# Patient Record
Sex: Female | Born: 1950 | Race: White | Hispanic: No | Smoking: Never smoker
Health system: Southern US, Community
[De-identification: ages and names within clinical notes are randomized; demographics above are authoritative.]

## PROBLEM LIST (undated history)

## (undated) DIAGNOSIS — K219 Gastro-esophageal reflux disease without esophagitis: Secondary | ICD-10-CM

## (undated) DIAGNOSIS — I10 Essential (primary) hypertension: Secondary | ICD-10-CM

## (undated) DIAGNOSIS — G809 Cerebral palsy, unspecified: Secondary | ICD-10-CM

## (undated) DIAGNOSIS — IMO0002 Reserved for concepts with insufficient information to code with codable children: Principal | ICD-10-CM

## (undated) DIAGNOSIS — S93439A Sprain of tibiofibular ligament of unspecified ankle, initial encounter: Secondary | ICD-10-CM

## (undated) DIAGNOSIS — S9304XA Dislocation of right ankle joint, initial encounter: Secondary | ICD-10-CM

## (undated) DIAGNOSIS — I872 Venous insufficiency (chronic) (peripheral): Secondary | ICD-10-CM

## (undated) DIAGNOSIS — I639 Cerebral infarction, unspecified: Secondary | ICD-10-CM

## (undated) DIAGNOSIS — R269 Unspecified abnormalities of gait and mobility: Secondary | ICD-10-CM

## (undated) DIAGNOSIS — E559 Vitamin D deficiency, unspecified: Secondary | ICD-10-CM

## (undated) DIAGNOSIS — M810 Age-related osteoporosis without current pathological fracture: Secondary | ICD-10-CM

## (undated) HISTORY — PX: ACHILLES TENDON SURGERY: SHX542

## (undated) HISTORY — PX: WRIST SURGERY: SHX841

## (undated) HISTORY — PX: CHOLECYSTECTOMY: SHX55

## (undated) HISTORY — PX: FRACTURE SURGERY: SHX138

## (undated) HISTORY — PX: KNEE SURGERY: SHX244

---

## 2013-10-17 ENCOUNTER — Encounter (HOSPITAL_COMMUNITY): Payer: Self-pay | Admitting: Pharmacy Technician

## 2013-10-20 ENCOUNTER — Encounter (HOSPITAL_COMMUNITY): Payer: Self-pay | Admitting: *Deleted

## 2013-10-20 MED ORDER — CEFAZOLIN SODIUM-DEXTROSE 2-3 GM-% IV SOLR
2.0000 g | INTRAVENOUS | Status: AC
  Administered 2013-10-21: 2 g via INTRAVENOUS
  Filled 2013-10-20: qty 50

## 2013-10-20 NOTE — H&P (Signed)
[facility] H&P  Chief Complaint: subacute/chronic R ankle dislocation HPI:   63 y/o white femail with hx of cerebral palsy, sustained a ground level fall in October of 2014.  Pt Had inability to bear weight on R leg. Was [facility] for [facility] where she was found to have a R ankle fracture with subluxation of talus.   She was reduced and splinted in the ED. She was discharged to SNF and had follow up with [facility].  She had serial cast changes as well and was treated without surgery.  On the most recent visit to [facility] it was noted that her reduction was not maintained. She was referred to OTS for definitive management.  She was seen and evaluated in our office her cast was removed, which had been on approximately 6 weeks, after additional xrays were obtained.  Her skin was checked as well.  Pt reports that she was ambulatory with a walker prior to this incident.  After much discussion of treatment options pt was agreeable to undergo tibiotalocalcaneal fusion with IMN due to prolonged subluxation of her talus and injury to her syndesmosis.   Past Medical History  Diagnosis Date  . Hypertension   . CVA (cerebral infarction)     Hx: of  . GERD (gastroesophageal reflux disease)   . Gait abnormality     Hx: of  . Cerebral palsy   . Osteoporosis     Past Surgical History  Procedure Laterality Date  . Fracture surgery      Hx: of right foot  . Knee surgery Right   . Wrist surgery Right   . Achilles tendon surgery Right   . Cholecystectomy      History reviewed. No pertinent family history. Social History:  has an unknown smoking status. She has never used smokeless tobacco. She reports that she does not drink alcohol or use illicit drugs.  Allergies:  Allergies  Allergen Reactions  . Bacitracin Other (See Comments)    Per MAR  . Bee Venom Other (See Comments)    Per MAR  . Erythromycin Base Other (See Comments)    Per MAR  .  Neomycin Other (See Comments)    Per MAR  . Polymyxin B Other (See Comments)    Per MAR    Medications Prior to Admission  Medication Sig Dispense Refill  . aspirin EC 81 MG tablet Take 81 mg by mouth daily.      . Calcium Carbonate-Vitamin D (CALCIUM 600+D HIGH POTENCY) 600-400 MG-UNIT per tablet Take 2 tablets by mouth daily.      Marland Kitchen. HYDROcodone-acetaminophen (NORCO) 10-325 MG per tablet Take 1 tablet by mouth 2 (two) times daily. Scheduled:  9am, 7pm  (Not to exceed 4 gms of acetaminophen in 24 hours)      . HYDROcodone-acetaminophen (NORCO/VICODIN) 5-325 MG per tablet Take 1 tablet by mouth every 4 (four) hours as needed for moderate pain. Not to exceed 4 gms of acetaminophen in 24 hours      . loratadine (CLARITIN) 10 MG tablet Take 10 mg by mouth daily.      Marland Kitchen. losartan-hydrochlorothiazide (HYZAAR) 100-12.5 MG per tablet Take 1 tablet by mouth daily.      Marland Kitchen. lovastatin (MEVACOR) 40 MG tablet Take 40 mg by mouth at bedtime.      . meloxicam (MOBIC) 7.5 MG tablet Take 7.5 mg by mouth daily.      . Omega-3 Fatty Acids (FISH OIL) 500 MG CAPS Take  500 mg by mouth daily.      Marland Kitchen omeprazole (PRILOSEC) 20 MG capsule Take 20 mg by mouth daily.      . polyethylene glycol (MIRALAX / GLYCOLAX) packet Take 17 g by mouth daily as needed (constipation).      Marland Kitchen azithromycin (ZITHROMAX) 500 MG tablet Take 500 mg by mouth daily. 3 day course started 10/09/13      . cyclobenzaprine (FLEXERIL) 10 MG tablet Take 10 mg by mouth 3 (three) times daily as needed for muscle spasms.      Marland Kitchen EPINEPHrine (EPIPEN) 0.3 mg/0.3 mL SOAJ injection Inject 0.3 mg into the muscle as needed (allergic reaction).          Review of Systems  Constitutional: Negative for fever and chills.  Respiratory: Negative for shortness of breath.   Cardiovascular: Negative for chest pain and palpitations.  Gastrointestinal: Negative for nausea and vomiting.  Genitourinary: Negative for dysuria.  Musculoskeletal:       No specific R  ankle pain  Contractures to R  Hand, wrist Valgus deformity  R knee   Neurological: Negative for headaches.    Height 5\' 6"  (1.676 m), weight 0 kg (0 lb). Physical Exam  Constitutional: She is oriented to person, place, and time.  Pleasant 63 y/o female in WC, NAD  HENT:  Head: Normocephalic and atraumatic.  Cardiovascular: Normal rate and regular rhythm.   Respiratory: Effort normal and breath sounds normal. She has no wheezes.  GI: Soft. Bowel sounds are normal. She exhibits no distension. There is no tenderness.  Musculoskeletal:  Right lower extremity notable for significant valgus to R knee. Skin intact, no evidence of pressures sores or ulcerations. Distal motor and sensory functions grossly intact Swelling controlled + DP pulse noted   Neurological: She is alert and oriented to person, place, and time.  Skin: Skin is warm.     Assessment/Plan  63 y/o female s/p fall with R ankle fx/dislocation with persistent lateral dislocation/subluxation R talus and syndesmotic disruption   OR for hindfoot fusion nail NWB x 8 weeks after surgery 2-3 day hospital stay Mobility will be limited by CP Lovenox post op x 3 weeks  Mearl Latin, PA-C [facility] [phone] (P) 10/21/2013, 7:04 AM

## 2013-10-20 NOTE — Progress Notes (Signed)
Pt SDW Call was completed by pt nurse Joetta MannersJodie Robinson of [facility]. Pt had CXR and EKG in October 2014; results requested via fax [facility] with [facility].

## 2013-10-21 ENCOUNTER — Inpatient Hospital Stay (HOSPITAL_COMMUNITY): Payer: Medicare Other

## 2013-10-21 ENCOUNTER — Encounter (HOSPITAL_COMMUNITY)
Admission: RE | Disposition: A | Discharge status: Skilled Nursing Facility | Payer: Self-pay | Source: Ambulatory Visit | Attending: Orthopedic Surgery

## 2013-10-21 ENCOUNTER — Encounter (HOSPITAL_COMMUNITY): Payer: Self-pay | Admitting: *Deleted

## 2013-10-21 ENCOUNTER — Inpatient Hospital Stay (HOSPITAL_COMMUNITY)
Admission: RE | Admit: 2013-10-21 | Discharge: 2013-10-24 | DRG: 493 | Disposition: A | Discharge status: Skilled Nursing Facility | Payer: Medicare Other | Source: Ambulatory Visit | Attending: Orthopedic Surgery | Admitting: Orthopedic Surgery

## 2013-10-21 ENCOUNTER — Inpatient Hospital Stay (HOSPITAL_COMMUNITY): Payer: Medicare Other | Admitting: Anesthesiology

## 2013-10-21 ENCOUNTER — Encounter (HOSPITAL_COMMUNITY): Payer: Medicare Other | Admitting: Anesthesiology

## 2013-10-21 DIAGNOSIS — I872 Venous insufficiency (chronic) (peripheral): Secondary | ICD-10-CM | POA: Diagnosis present

## 2013-10-21 DIAGNOSIS — Z23 Encounter for immunization: Secondary | ICD-10-CM

## 2013-10-21 DIAGNOSIS — E785 Hyperlipidemia, unspecified: Secondary | ICD-10-CM | POA: Diagnosis present

## 2013-10-21 DIAGNOSIS — S8290XS Unspecified fracture of unspecified lower leg, sequela: Secondary | ICD-10-CM

## 2013-10-21 DIAGNOSIS — M24473 Recurrent dislocation, unspecified ankle: Secondary | ICD-10-CM | POA: Diagnosis present

## 2013-10-21 DIAGNOSIS — G809 Cerebral palsy, unspecified: Secondary | ICD-10-CM | POA: Diagnosis present

## 2013-10-21 DIAGNOSIS — Z888 Allergy status to other drugs, medicaments and biological substances status: Secondary | ICD-10-CM

## 2013-10-21 DIAGNOSIS — M24476 Recurrent dislocation, unspecified foot: Secondary | ICD-10-CM

## 2013-10-21 DIAGNOSIS — IMO0002 Reserved for concepts with insufficient information to code with codable children: Principal | ICD-10-CM | POA: Diagnosis present

## 2013-10-21 DIAGNOSIS — Z8673 Personal history of transient ischemic attack (TIA), and cerebral infarction without residual deficits: Secondary | ICD-10-CM

## 2013-10-21 DIAGNOSIS — R259 Unspecified abnormal involuntary movements: Secondary | ICD-10-CM | POA: Diagnosis present

## 2013-10-21 DIAGNOSIS — Z7982 Long term (current) use of aspirin: Secondary | ICD-10-CM

## 2013-10-21 DIAGNOSIS — X088XXS Exposure to other specified smoke, fire and flames, sequela: Secondary | ICD-10-CM

## 2013-10-21 DIAGNOSIS — K219 Gastro-esophageal reflux disease without esophagitis: Secondary | ICD-10-CM | POA: Diagnosis present

## 2013-10-21 DIAGNOSIS — I10 Essential (primary) hypertension: Secondary | ICD-10-CM | POA: Diagnosis present

## 2013-10-21 DIAGNOSIS — D62 Acute posthemorrhagic anemia: Secondary | ICD-10-CM | POA: Diagnosis not present

## 2013-10-21 DIAGNOSIS — Z881 Allergy status to other antibiotic agents status: Secondary | ICD-10-CM

## 2013-10-21 DIAGNOSIS — Z79899 Other long term (current) drug therapy: Secondary | ICD-10-CM

## 2013-10-21 DIAGNOSIS — M810 Age-related osteoporosis without current pathological fracture: Secondary | ICD-10-CM | POA: Diagnosis present

## 2013-10-21 DIAGNOSIS — S93439A Sprain of tibiofibular ligament of unspecified ankle, initial encounter: Secondary | ICD-10-CM | POA: Diagnosis present

## 2013-10-21 DIAGNOSIS — Z91038 Other insect allergy status: Secondary | ICD-10-CM

## 2013-10-21 DIAGNOSIS — E559 Vitamin D deficiency, unspecified: Secondary | ICD-10-CM | POA: Diagnosis present

## 2013-10-21 DIAGNOSIS — S9304XA Dislocation of right ankle joint, initial encounter: Secondary | ICD-10-CM | POA: Diagnosis present

## 2013-10-21 HISTORY — DX: Vitamin D deficiency, unspecified: E55.9

## 2013-10-21 HISTORY — DX: Cerebral infarction, unspecified: I63.9

## 2013-10-21 HISTORY — DX: Cerebral palsy, unspecified: G80.9

## 2013-10-21 HISTORY — DX: Sprain of tibiofibular ligament of unspecified ankle, initial encounter: S93.439A

## 2013-10-21 HISTORY — DX: Venous insufficiency (chronic) (peripheral): I87.2

## 2013-10-21 HISTORY — DX: Gastro-esophageal reflux disease without esophagitis: K21.9

## 2013-10-21 HISTORY — DX: Reserved for concepts with insufficient information to code with codable children: IMO0002

## 2013-10-21 HISTORY — DX: Dislocation of right ankle joint, initial encounter: S93.04XA

## 2013-10-21 HISTORY — DX: Essential (primary) hypertension: I10

## 2013-10-21 HISTORY — DX: Age-related osteoporosis without current pathological fracture: M81.0

## 2013-10-21 HISTORY — DX: Unspecified abnormalities of gait and mobility: R26.9

## 2013-10-21 HISTORY — PX: FOOT ARTHRODESIS: SHX1655

## 2013-10-21 LAB — COMPREHENSIVE METABOLIC PANEL
ALBUMIN: 3 g/dL — AB (ref 3.5–5.2)
ALT: 23 U/L (ref 0–35)
AST: 23 U/L (ref 0–37)
Alkaline Phosphatase: 53 U/L (ref 39–117)
BILIRUBIN TOTAL: 0.6 mg/dL (ref 0.3–1.2)
BUN: 12 mg/dL (ref 6–23)
CHLORIDE: 99 meq/L (ref 96–112)
CO2: 22 meq/L (ref 19–32)
CREATININE: 0.64 mg/dL (ref 0.50–1.10)
Calcium: 9.8 mg/dL (ref 8.4–10.5)
GFR calc Af Amer: >90 mL/min (ref >90)
Glucose, Bld: 142 mg/dL — ABNORMAL HIGH (ref 70–99)
POTASSIUM: 3.7 meq/L (ref 3.7–5.3)
Sodium: 136 mEq/L — ABNORMAL LOW (ref 137–147)
Total Protein: 8.3 g/dL (ref 6.0–8.3)

## 2013-10-21 LAB — CREATININE, SERUM: CREATININE: 0.59 mg/dL (ref 0.50–1.10)

## 2013-10-21 LAB — CBC WITH DIFFERENTIAL/PLATELET
BASOS ABS: 0 10*3/uL (ref 0.0–0.1)
Basophils Relative: 0 % (ref 0–1)
Eosinophils Absolute: 0.3 10*3/uL (ref 0.0–0.7)
Eosinophils Relative: 2 % (ref 0–5)
HEMATOCRIT: 43.5 % (ref 36.0–46.0)
HEMOGLOBIN: 14.6 g/dL (ref 12.0–15.0)
LYMPHS PCT: 14 % (ref 12–46)
Lymphs Abs: 1.6 10*3/uL (ref 0.7–4.0)
MCH: 31.5 pg (ref 26.0–34.0)
MCHC: 33.6 g/dL (ref 30.0–36.0)
MCV: 94 fL (ref 78.0–100.0)
MONO ABS: 0.8 10*3/uL (ref 0.1–1.0)
MONOS PCT: 7 % (ref 3–12)
NEUTROS ABS: 8.6 10*3/uL — AB (ref 1.7–7.7)
NEUTROS PCT: 76 % (ref 43–77)
Platelets: 460 10*3/uL — ABNORMAL HIGH (ref 150–400)
RBC: 4.63 MIL/uL (ref 3.87–5.11)
RDW: 14.4 % (ref 11.5–15.5)
WBC: 11.3 10*3/uL — ABNORMAL HIGH (ref 4.0–10.5)

## 2013-10-21 LAB — GLUCOSE, CAPILLARY: GLUCOSE-CAPILLARY: 106 mg/dL — AB (ref 70–99)

## 2013-10-21 LAB — CBC
HCT: 38.6 % (ref 36.0–46.0)
Hemoglobin: 12.6 g/dL (ref 12.0–15.0)
MCH: 31 pg (ref 26.0–34.0)
MCHC: 32.6 g/dL (ref 30.0–36.0)
MCV: 94.8 fL (ref 78.0–100.0)
Platelets: 413 10*3/uL — ABNORMAL HIGH (ref 150–400)
RBC: 4.07 MIL/uL (ref 3.87–5.11)
RDW: 14.5 % (ref 11.5–15.5)
WBC: 18.2 10*3/uL — ABNORMAL HIGH (ref 4.0–10.5)

## 2013-10-21 LAB — APTT: aPTT: 27 seconds (ref 24–37)

## 2013-10-21 LAB — PROTIME-INR
INR: 0.99 (ref 0.00–1.49)
Prothrombin Time: 12.9 seconds (ref 11.6–15.2)

## 2013-10-21 SURGERY — FUSION, JOINT, FOOT
Anesthesia: Regional | Site: Foot | Laterality: Right

## 2013-10-21 MED ORDER — BISACODYL 10 MG RE SUPP: 10.0000 mg | Freq: Every day | RECTAL | Status: DC | PRN

## 2013-10-21 MED ORDER — ALBUTEROL SULFATE HFA 108 (90 BASE) MCG/ACT IN AERS
INHALATION_SPRAY | RESPIRATORY_TRACT | Status: AC
  Filled 2013-10-21: qty 6.7

## 2013-10-21 MED ORDER — ACETAMINOPHEN 500 MG PO TABS
1000.0000 mg | ORAL_TABLET | Freq: Once | ORAL | Status: AC
  Administered 2013-10-21: 1000 mg via ORAL
  Filled 2013-10-21: qty 2

## 2013-10-21 MED ORDER — EPHEDRINE SULFATE 50 MG/ML IJ SOLN
INTRAMUSCULAR | Status: AC
  Filled 2013-10-21: qty 1

## 2013-10-21 MED ORDER — INFLUENZA VAC SPLIT QUAD 0.5 ML IM SUSP
0.5000 mL | INTRAMUSCULAR | Status: AC
  Administered 2013-10-22: 0.5 mL via INTRAMUSCULAR
  Filled 2013-10-21: qty 0.5

## 2013-10-21 MED ORDER — POLYETHYLENE GLYCOL 3350 17 G PO PACK: 17.0000 g | PACK | Freq: Every day | ORAL | Status: DC | PRN

## 2013-10-21 MED ORDER — LACTATED RINGERS IV SOLN
INTRAVENOUS | Status: DC | PRN
  Administered 2013-10-21 (×2): via INTRAVENOUS

## 2013-10-21 MED ORDER — ASPIRIN EC 81 MG PO TBEC
81.0000 mg | DELAYED_RELEASE_TABLET | Freq: Every day | ORAL | Status: DC
  Administered 2013-10-22 – 2013-10-24 (×3): 81 mg via ORAL
  Filled 2013-10-21 (×4): qty 1

## 2013-10-21 MED ORDER — ONDANSETRON HCL 4 MG/2ML IJ SOLN: 4.0000 mg | Freq: Four times a day (QID) | INTRAMUSCULAR | Status: DC | PRN

## 2013-10-21 MED ORDER — PHENYLEPHRINE HCL 10 MG/ML IJ SOLN
INTRAMUSCULAR | Status: DC | PRN
  Administered 2013-10-21 (×6): 40 ug via INTRAVENOUS

## 2013-10-21 MED ORDER — DIPHENHYDRAMINE HCL 12.5 MG/5ML PO ELIX: 12.5000 mg | ORAL_SOLUTION | ORAL | Status: DC | PRN

## 2013-10-21 MED ORDER — GLYCOPYRROLATE 0.2 MG/ML IJ SOLN
INTRAMUSCULAR | Status: AC
  Filled 2013-10-21: qty 1

## 2013-10-21 MED ORDER — STERILE WATER FOR INJECTION IJ SOLN
INTRAMUSCULAR | Status: AC
  Filled 2013-10-21: qty 10

## 2013-10-21 MED ORDER — LIDOCAINE HCL (CARDIAC) 20 MG/ML IV SOLN
INTRAVENOUS | Status: DC | PRN
  Administered 2013-10-21: 80 mg via INTRAVENOUS

## 2013-10-21 MED ORDER — GLYCOPYRROLATE 0.2 MG/ML IJ SOLN
INTRAMUSCULAR | Status: DC | PRN
  Administered 2013-10-21 (×2): 0.1 mg via INTRAVENOUS
  Administered 2013-10-21: 0.6 mg via INTRAVENOUS

## 2013-10-21 MED ORDER — OXYCODONE HCL 5 MG PO TABS
5.0000 mg | ORAL_TABLET | ORAL | Status: DC | PRN
  Administered 2013-10-21: 5 mg via ORAL
  Filled 2013-10-21: qty 2
  Filled 2013-10-21: qty 1

## 2013-10-21 MED ORDER — BUPIVACAINE-EPINEPHRINE PF 0.5-1:200000 % IJ SOLN
INTRAMUSCULAR | Status: DC | PRN
  Administered 2013-10-21: 30 mL via PERINEURAL

## 2013-10-21 MED ORDER — GLYCOPYRROLATE 0.2 MG/ML IJ SOLN
INTRAMUSCULAR | Status: AC
  Filled 2013-10-21: qty 3

## 2013-10-21 MED ORDER — FENTANYL CITRATE 0.05 MG/ML IJ SOLN
INTRAMUSCULAR | Status: DC | PRN
  Administered 2013-10-21: 50 ug via INTRAVENOUS
  Administered 2013-10-21: 100 ug via INTRAVENOUS
  Administered 2013-10-21 (×2): 50 ug via INTRAVENOUS

## 2013-10-21 MED ORDER — CEFAZOLIN SODIUM 1-5 GM-% IV SOLN
1.0000 g | Freq: Four times a day (QID) | INTRAVENOUS | Status: AC
  Administered 2013-10-21 – 2013-10-22 (×3): 1 g via INTRAVENOUS
  Filled 2013-10-21 (×3): qty 50

## 2013-10-21 MED ORDER — CYCLOBENZAPRINE HCL 10 MG PO TABS
10.0000 mg | ORAL_TABLET | Freq: Three times a day (TID) | ORAL | Status: DC | PRN
  Administered 2013-10-22: 10 mg via ORAL
  Filled 2013-10-21: qty 1

## 2013-10-21 MED ORDER — CALCIUM CARBONATE-VITAMIN D 500-200 MG-UNIT PO TABS
2.0000 | ORAL_TABLET | Freq: Every day | ORAL | Status: DC
  Administered 2013-10-22 – 2013-10-24 (×3): 2 via ORAL
  Filled 2013-10-21 (×3): qty 2

## 2013-10-21 MED ORDER — ROCURONIUM BROMIDE 50 MG/5ML IV SOLN
INTRAVENOUS | Status: AC
  Filled 2013-10-21: qty 1

## 2013-10-21 MED ORDER — NEOSTIGMINE METHYLSULFATE 1 MG/ML IJ SOLN
INTRAMUSCULAR | Status: AC
  Filled 2013-10-21: qty 10

## 2013-10-21 MED ORDER — MORPHINE SULFATE 2 MG/ML IJ SOLN: 1.0000 mg | INTRAMUSCULAR | Status: DC | PRN

## 2013-10-21 MED ORDER — PROPOFOL 10 MG/ML IV BOLUS
INTRAVENOUS | Status: AC
  Filled 2013-10-21: qty 20

## 2013-10-21 MED ORDER — MIDAZOLAM HCL 5 MG/5ML IJ SOLN
INTRAMUSCULAR | Status: DC | PRN
  Administered 2013-10-21 (×2): 1 mg via INTRAVENOUS

## 2013-10-21 MED ORDER — EPHEDRINE SULFATE 50 MG/ML IJ SOLN
INTRAMUSCULAR | Status: DC | PRN
  Administered 2013-10-21: 5 mg via INTRAVENOUS

## 2013-10-21 MED ORDER — PHENYLEPHRINE 40 MCG/ML (10ML) SYRINGE FOR IV PUSH (FOR BLOOD PRESSURE SUPPORT)
PREFILLED_SYRINGE | INTRAVENOUS | Status: AC
  Filled 2013-10-21: qty 10

## 2013-10-21 MED ORDER — PROPOFOL 10 MG/ML IV BOLUS
INTRAVENOUS | Status: DC | PRN
  Administered 2013-10-21: 140 mg via INTRAVENOUS

## 2013-10-21 MED ORDER — ONDANSETRON HCL 4 MG/2ML IJ SOLN
INTRAMUSCULAR | Status: DC | PRN
Start: 2013-10-21 — End: 2013-10-21
  Administered 2013-10-21: 4 mg via INTRAVENOUS

## 2013-10-21 MED ORDER — SIMVASTATIN 20 MG PO TABS
20.0000 mg | ORAL_TABLET | Freq: Every day | ORAL | Status: DC
  Administered 2013-10-21: 20 mg via ORAL
  Filled 2013-10-21 (×4): qty 1

## 2013-10-21 MED ORDER — PANTOPRAZOLE SODIUM 40 MG PO TBEC
40.0000 mg | DELAYED_RELEASE_TABLET | Freq: Every day | ORAL | Status: DC
  Administered 2013-10-22 – 2013-10-24 (×3): 40 mg via ORAL
  Filled 2013-10-21 (×3): qty 1

## 2013-10-21 MED ORDER — FENTANYL CITRATE 0.05 MG/ML IJ SOLN
INTRAMUSCULAR | Status: AC
  Filled 2013-10-21: qty 5

## 2013-10-21 MED ORDER — LORATADINE 10 MG PO TABS
10.0000 mg | ORAL_TABLET | Freq: Every day | ORAL | Status: DC
  Administered 2013-10-22 – 2013-10-24 (×3): 10 mg via ORAL
  Filled 2013-10-21 (×4): qty 1

## 2013-10-21 MED ORDER — DOCUSATE SODIUM 100 MG PO CAPS
100.0000 mg | ORAL_CAPSULE | Freq: Two times a day (BID) | ORAL | Status: DC
  Administered 2013-10-21 – 2013-10-24 (×6): 100 mg via ORAL
  Filled 2013-10-21 (×8): qty 1

## 2013-10-21 MED ORDER — METOCLOPRAMIDE HCL 5 MG PO TABS
5.0000 mg | ORAL_TABLET | Freq: Three times a day (TID) | ORAL | Status: DC | PRN
  Filled 2013-10-21: qty 2

## 2013-10-21 MED ORDER — EPINEPHRINE 0.3 MG/0.3ML IJ SOAJ: 0.3000 mg | INTRAMUSCULAR | Status: DC | PRN

## 2013-10-21 MED ORDER — ENOXAPARIN SODIUM 40 MG/0.4ML ~~LOC~~ SOLN
40.0000 mg | SUBCUTANEOUS | Status: DC
  Administered 2013-10-22 – 2013-10-23 (×2): 40 mg via SUBCUTANEOUS
  Filled 2013-10-21 (×4): qty 0.4

## 2013-10-21 MED ORDER — OXYCODONE-ACETAMINOPHEN 5-325 MG PO TABS
1.0000 | ORAL_TABLET | ORAL | Status: DC | PRN
  Administered 2013-10-21 – 2013-10-23 (×7): 2 via ORAL
  Filled 2013-10-21 (×7): qty 2

## 2013-10-21 MED ORDER — SENNOSIDES-DOCUSATE SODIUM 8.6-50 MG PO TABS
1.0000 | ORAL_TABLET | Freq: Every evening | ORAL | Status: DC | PRN
  Administered 2013-10-22: 1 via ORAL
  Filled 2013-10-21: qty 1

## 2013-10-21 MED ORDER — METOCLOPRAMIDE HCL 5 MG/ML IJ SOLN: 5.0000 mg | Freq: Three times a day (TID) | INTRAMUSCULAR | Status: DC | PRN

## 2013-10-21 MED ORDER — MIDAZOLAM HCL 2 MG/2ML IJ SOLN
INTRAMUSCULAR | Status: AC
  Filled 2013-10-21: qty 2

## 2013-10-21 MED ORDER — ONDANSETRON HCL 4 MG PO TABS: 4.0000 mg | ORAL_TABLET | Freq: Four times a day (QID) | ORAL | Status: DC | PRN

## 2013-10-21 MED ORDER — POTASSIUM CHLORIDE IN NACL 20-0.9 MEQ/L-% IV SOLN
INTRAVENOUS | Status: DC
  Administered 2013-10-22 (×2): via INTRAVENOUS
  Filled 2013-10-21 (×3): qty 1000

## 2013-10-21 MED ORDER — OXYCODONE HCL 5 MG/5ML PO SOLN: 5.0000 mg | Freq: Once | ORAL | Status: DC | PRN

## 2013-10-21 MED ORDER — ALBUTEROL SULFATE HFA 108 (90 BASE) MCG/ACT IN AERS
INHALATION_SPRAY | RESPIRATORY_TRACT | Status: DC | PRN
  Administered 2013-10-21: 2 via RESPIRATORY_TRACT

## 2013-10-21 MED ORDER — 0.9 % SODIUM CHLORIDE (POUR BTL) OPTIME
TOPICAL | Status: DC | PRN
  Administered 2013-10-21: 1000 mL

## 2013-10-21 MED ORDER — ONDANSETRON HCL 4 MG/2ML IJ SOLN
INTRAMUSCULAR | Status: AC
  Filled 2013-10-21: qty 2

## 2013-10-21 MED ORDER — ROCURONIUM BROMIDE 100 MG/10ML IV SOLN
INTRAVENOUS | Status: DC | PRN
  Administered 2013-10-21: 20 mg via INTRAVENOUS
  Administered 2013-10-21: 50 mg via INTRAVENOUS

## 2013-10-21 MED ORDER — OXYCODONE HCL 5 MG PO TABS: 5.0000 mg | ORAL_TABLET | Freq: Once | ORAL | Status: DC | PRN

## 2013-10-21 MED ORDER — NEOSTIGMINE METHYLSULFATE 1 MG/ML IJ SOLN
INTRAMUSCULAR | Status: DC | PRN
  Administered 2013-10-21: 4 mg via INTRAVENOUS

## 2013-10-21 MED ORDER — HYDROMORPHONE HCL PF 1 MG/ML IJ SOLN: 0.2500 mg | INTRAMUSCULAR | Status: DC | PRN

## 2013-10-21 MED ORDER — LIDOCAINE HCL (CARDIAC) 20 MG/ML IV SOLN
INTRAVENOUS | Status: AC
  Filled 2013-10-21: qty 5

## 2013-10-21 SURGICAL SUPPLY — 78 items
BANDAGE ELASTIC 4 VELCRO ST LF (GAUZE/BANDAGES/DRESSINGS) ×2 IMPLANT
BANDAGE ELASTIC 6 VELCRO ST LF (GAUZE/BANDAGES/DRESSINGS) ×2 IMPLANT
BANDAGE ESMARK 6X9 LF (GAUZE/BANDAGES/DRESSINGS) IMPLANT
BANDAGE GAUZE ELAST BULKY 4 IN (GAUZE/BANDAGES/DRESSINGS) ×4 IMPLANT
BIT DRILL CALIBRATED 4.3X320MM (BIT) ×1 IMPLANT
BIT DRILL CANN 7X200 (BIT) ×2 IMPLANT
BLADE SAW SGTL 81X20 HD (BLADE) ×2 IMPLANT
BLADE SURG 10 STRL SS (BLADE) ×2 IMPLANT
BLADE SURG 15 STRL LF DISP TIS (BLADE) ×2 IMPLANT
BLADE SURG 15 STRL SS (BLADE) ×2
BNDG COHESIVE 4X5 TAN STRL (GAUZE/BANDAGES/DRESSINGS) IMPLANT
BNDG ESMARK 6X9 LF (GAUZE/BANDAGES/DRESSINGS)
BRUSH SCRUB DISP (MISCELLANEOUS) ×6 IMPLANT
CLOTH BEACON ORANGE TIMEOUT ST (SAFETY) ×2 IMPLANT
COVER MAYO STAND STRL (DRAPES) IMPLANT
COVER SURGICAL LIGHT HANDLE (MISCELLANEOUS) ×2 IMPLANT
CUFF TOURNIQUET SINGLE 34IN LL (TOURNIQUET CUFF) IMPLANT
DRAPE C-ARM 42X72 X-RAY (DRAPES) ×2 IMPLANT
DRAPE C-ARMOR (DRAPES) ×2 IMPLANT
DRAPE ORTHO SPLIT 77X108 STRL (DRAPES)
DRAPE SURG ORHT 6 SPLT 77X108 (DRAPES) IMPLANT
DRAPE U-SHAPE 47X51 STRL (DRAPES) ×2 IMPLANT
DRILL CALIBRATED 4.3X320MM (BIT) ×2
DRSG ADAPTIC 3X8 NADH LF (GAUZE/BANDAGES/DRESSINGS) ×4 IMPLANT
DRSG EMULSION OIL 3X3 NADH (GAUZE/BANDAGES/DRESSINGS) IMPLANT
ELECT REM PT RETURN 9FT ADLT (ELECTROSURGICAL) ×2
ELECTRODE REM PT RTRN 9FT ADLT (ELECTROSURGICAL) ×1 IMPLANT
GLOVE BIO SURGEON STRL SZ7.5 (GLOVE) ×2 IMPLANT
GLOVE BIO SURGEON STRL SZ8 (GLOVE) ×4 IMPLANT
GLOVE BIOGEL PI IND STRL 6.5 (GLOVE) ×1 IMPLANT
GLOVE BIOGEL PI IND STRL 7.0 (GLOVE) IMPLANT
GLOVE BIOGEL PI IND STRL 7.5 (GLOVE) ×1 IMPLANT
GLOVE BIOGEL PI INDICATOR 6.5 (GLOVE) ×1
GLOVE BIOGEL PI INDICATOR 7.0 (GLOVE)
GLOVE BIOGEL PI INDICATOR 7.5 (GLOVE) ×1
GLOVE SURG SS PI 6.5 STRL IVOR (GLOVE) ×2 IMPLANT
GLOVE SURG SS PI 7.0 STRL IVOR (GLOVE) ×2 IMPLANT
GOWN STRL REUS W/ TWL LRG LVL3 (GOWN DISPOSABLE) ×2 IMPLANT
GOWN STRL REUS W/ TWL XL LVL3 (GOWN DISPOSABLE) ×1 IMPLANT
GOWN STRL REUS W/TWL LRG LVL3 (GOWN DISPOSABLE) ×2
GOWN STRL REUS W/TWL XL LVL3 (GOWN DISPOSABLE) ×1
GUIDEPIN 3.2X17.5 THRD DISP (PIN) ×2 IMPLANT
GUIDEWIRE 2.6X80 BEAD TIP (Wire) ×1 IMPLANT
GUIDWIRE 2.6X80 BEAD TIP (Wire) ×2 IMPLANT
KIT BASIN OR (CUSTOM PROCEDURE TRAY) ×2 IMPLANT
KIT ROOM TURNOVER OR (KITS) ×2 IMPLANT
MANIFOLD NEPTUNE II (INSTRUMENTS) IMPLANT
NAIL LOCK ANKLE ANTE 11X180 (Nail) ×2 IMPLANT
NS IRRIG 1000ML POUR BTL (IV SOLUTION) ×2 IMPLANT
PACK ORTHO EXTREMITY (CUSTOM PROCEDURE TRAY) ×2 IMPLANT
PAD ABD 8X10 STRL (GAUZE/BANDAGES/DRESSINGS) ×4 IMPLANT
PAD ARMBOARD 7.5X6 YLW CONV (MISCELLANEOUS) ×2 IMPLANT
PAD CAST 4YDX4 CTTN HI CHSV (CAST SUPPLIES) IMPLANT
PADDING CAST ABS 4INX4YD NS (CAST SUPPLIES) ×1
PADDING CAST ABS 6INX4YD NS (CAST SUPPLIES) ×1
PADDING CAST ABS COTTON 4X4 ST (CAST SUPPLIES) ×1 IMPLANT
PADDING CAST ABS COTTON 6X4 NS (CAST SUPPLIES) ×1 IMPLANT
PADDING CAST COTTON 4X4 STRL (CAST SUPPLIES)
PADDING CAST COTTON 6X4 STRL (CAST SUPPLIES) IMPLANT
PENCIL BUTTON HOLSTER BLD 10FT (ELECTRODE) ×2 IMPLANT
SCREW CORT TI DBL LEAD 5X22 (Screw) ×2 IMPLANT
SCREW CORT TI DBL LEAD 5X30 (Screw) ×2 IMPLANT
SCREW CORT TI DBL LEAD 5X32 (Screw) ×2 IMPLANT
SCREW CORT TI DBL LEAD 5X60 (Screw) ×2 IMPLANT
SPONGE GAUZE 4X4 12PLY (GAUZE/BANDAGES/DRESSINGS) ×2 IMPLANT
SPONGE LAP 18X18 X RAY DECT (DISPOSABLE) ×2 IMPLANT
SPONGE SCRUB IODOPHOR (GAUZE/BANDAGES/DRESSINGS) IMPLANT
SUCTION FRAZIER TIP 10 FR DISP (SUCTIONS) ×2 IMPLANT
SUT ETHILON 3 0 PS 1 (SUTURE) ×2 IMPLANT
SUT VIC AB 2-0 CT3 27 (SUTURE) IMPLANT
SUT VIC AB 2-0 SH 18 (SUTURE) ×2 IMPLANT
SUT VIC AB 3-0 FS2 27 (SUTURE) ×2 IMPLANT
SYSTEM CHEST DRAIN TLS 7FR (DRAIN) IMPLANT
TOWEL OR 17X24 6PK STRL BLUE (TOWEL DISPOSABLE) ×2 IMPLANT
TOWEL OR 17X26 10 PK STRL BLUE (TOWEL DISPOSABLE) ×4 IMPLANT
TUBE CONNECTING 12X1/4 (SUCTIONS) ×2 IMPLANT
UNDERPAD 30X30 INCONTINENT (UNDERPADS AND DIAPERS) ×6 IMPLANT
WATER STERILE IRR 1000ML POUR (IV SOLUTION) IMPLANT

## 2013-10-21 NOTE — Brief Op Note (Addendum)
10/21/2013  11:09 AM  PATIENT:  Kathy Harvey  63 y.o. 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 RIGHT Tibiotalarcaneal Fusion (Right)  With Biomet nail  2. Syndesmotic fusion, distal  SURGEON:  Surgeon(s) and Role:    * Budd PalmerMichael H Champion Corales, MD - Primary  PHYSICIAN ASSISTANT: Montez MoritaKeith Paul, PA-C  ANESTHESIA:   general  I/O:  Total I/O In: 1000 [I.V.:1000] Out: 450 [Urine:350; Blood:100]  SPECIMEN:  No Specimen  TOURNIQUET:  * No tourniquets in log *  DICTATION: .Other Dictation: Dictation Number [id number]

## 2013-10-21 NOTE — H&P (Signed)
I have seen and examined the patient. I agree with the findings above.  I discussed with the patient the risks and benefits of surgery, including the possibility of infection, nerve injury, vessel injury, wound breakdown, arthritis, malunion, nonunion, symptomatic hardware, DVT/ PE, loss of motion, and need for further surgery among others. She understood these risks and wished to proceed.   Budd PalmerHANDY,Elizebeth Kluesner H, MD 10/21/2013 7:53 AM

## 2013-10-21 NOTE — Progress Notes (Signed)
Orthopedic Tech Progress Note Patient Details:  Kathy Harvey September 21, 1951 [id number]  Patient ID: Kathy Harvey, female   DOB: September 21, 1951, 63 y.o.   MRN: [id number] Trapeze bar patient helper  Nikki DomCrawford, Jadence Kinlaw 10/21/2013, 3:47 PM

## 2013-10-21 NOTE — Anesthesia Procedure Notes (Signed)
Anesthesia Regional Block:  Popliteal block  Pre-Anesthetic Checklist: ,, timeout performed, Correct Patient, Correct Site, Correct Laterality, Correct Procedure, Correct Position, site marked, Risks and benefits discussed,  Surgical consent,  Pre-op evaluation,  At surgeon's request and post-op pain management  Laterality: Right  Prep: chloraprep       Needles:  Injection technique: Single-shot  Needle Type: Echogenic Stimulator Needle     Needle Length:cm 9 cm Needle Gauge: 21 and 21 G    Additional Needles:  Procedures: ultrasound guided (picture in chart) and nerve stimulator Popliteal block  Nerve Stimulator or Paresthesia:  Response: plantar flexion of foot, 0.45 mA,   Additional Responses:   Narrative:  Start time: 10/21/2013 7:48 AM End time: 10/21/2013 7:59 AM Injection made incrementally with aspirations every 5 mL.  Performed by: Personally  Anesthesiologist: Dr Chaney MallingHodierne  Additional Notes: Functioning IV was confirmed and monitors were applied.  A 90mm 21ga Arrow echogenic stimulator needle was used. Sterile prep and drape,hand hygiene and sterile gloves were used.  Negative aspiration and negative test dose prior to incremental administration of local anesthetic. The patient tolerated the procedure well.  Ultrasound guidance: relevent anatomy identified, needle position confirmed, local anesthetic spread visualized around nerve(s), vascular puncture avoided.  Image printed for medical record.

## 2013-10-21 NOTE — Preoperative (Signed)
Beta Blockers   Reason not to administer Beta Blockers:Not Applicable 

## 2013-10-21 NOTE — Anesthesia Preprocedure Evaluation (Signed)
Anesthesia Evaluation  Patient identified by MRN, date of birth, ID band Patient awake    Reviewed: Allergy & Precautions, H&P , NPO status , Patient's Chart, lab work & pertinent test results  Airway Mallampati: II  Neck ROM: full    Dental   Pulmonary          Cardiovascular hypertension,     Neuro/Psych Cerebral palsy CVA    GI/Hepatic GERD-  ,  Endo/Other    Renal/GU      Musculoskeletal   Abdominal   Peds  Hematology   Anesthesia Other Findings   Reproductive/Obstetrics                           Anesthesia Physical Anesthesia Plan  ASA: II  Anesthesia Plan: General and Regional   Post-op Pain Management: MAC Combined w/ Regional for Post-op pain   Induction: Intravenous  Airway Management Planned: Oral ETT  Additional Equipment:   Intra-op Plan:   Post-operative Plan: Extubation in OR  Informed Consent: I have reviewed the patients History and Physical, chart, labs and discussed the procedure including the risks, benefits and alternatives for the proposed anesthesia with the patient or authorized representative who has indicated his/her understanding and acceptance.     Plan Discussed with: CRNA, Anesthesiologist and Surgeon  Anesthesia Plan Comments:         Anesthesia Quick Evaluation

## 2013-10-21 NOTE — Transfer of Care (Signed)
Immediate Anesthesia Transfer of Care Note  Patient: Kathy Harvey  Procedure(s) Performed: Procedure(s): RIGHT Tibiocalocaneal Fusion (Right)  Patient Location: PACU  Anesthesia Type:General and Regional  Level of Consciousness: awake, patient cooperative and responds to stimulation  Airway & Oxygen Therapy: Patient Spontanous Breathing and Patient connected to nasal cannula oxygen  Post-op Assessment: Report given to PACU RN and Post -op Vital signs reviewed and stable  Post vital signs: Reviewed and stable  Complications: No apparent anesthesia complications

## 2013-10-21 NOTE — Anesthesia Postprocedure Evaluation (Signed)
Anesthesia Post Note  Patient: Kathy Harvey  Procedure(s) Performed: Procedure(s) (LRB): RIGHT Tibiocalocaneal Fusion (Right)  Anesthesia type: General and popliteal nerve block  Patient location: PACU  Post pain: Pain level controlled and Adequate analgesia  Post assessment: Post-op Vital signs reviewed, Patient's Cardiovascular Status Stable, Respiratory Function Stable, Patent Airway and Pain level controlled  Last Vitals:  Filed Vitals:   10/21/13 1200  BP: 107/53  Pulse: 59  Temp:   Resp: 15    Post vital signs: Reviewed and stable  Level of consciousness: awake, alert  and oriented  Complications: No apparent anesthesia complications

## 2013-10-22 ENCOUNTER — Encounter (HOSPITAL_COMMUNITY): Payer: Self-pay | Admitting: Orthopedic Surgery

## 2013-10-22 DIAGNOSIS — I10 Essential (primary) hypertension: Secondary | ICD-10-CM | POA: Diagnosis present

## 2013-10-22 DIAGNOSIS — G809 Cerebral palsy, unspecified: Secondary | ICD-10-CM | POA: Diagnosis present

## 2013-10-22 DIAGNOSIS — M810 Age-related osteoporosis without current pathological fracture: Secondary | ICD-10-CM | POA: Diagnosis present

## 2013-10-22 DIAGNOSIS — K219 Gastro-esophageal reflux disease without esophagitis: Secondary | ICD-10-CM | POA: Diagnosis present

## 2013-10-22 LAB — CBC
HCT: 35.5 % — ABNORMAL LOW (ref 36.0–46.0)
Hemoglobin: 11.5 g/dL — ABNORMAL LOW (ref 12.0–15.0)
MCH: 30.7 pg (ref 26.0–34.0)
MCHC: 32.4 g/dL (ref 30.0–36.0)
MCV: 94.7 fL (ref 78.0–100.0)
PLATELETS: 359 10*3/uL (ref 150–400)
RBC: 3.75 MIL/uL — AB (ref 3.87–5.11)
RDW: 14.5 % (ref 11.5–15.5)
WBC: 10 10*3/uL (ref 4.0–10.5)

## 2013-10-22 LAB — BASIC METABOLIC PANEL
BUN: 7 mg/dL (ref 6–23)
CALCIUM: 9 mg/dL (ref 8.4–10.5)
CO2: 22 meq/L (ref 19–32)
CREATININE: 0.54 mg/dL (ref 0.50–1.10)
Chloride: 102 mEq/L (ref 96–112)
Glucose, Bld: 109 mg/dL — ABNORMAL HIGH (ref 70–99)
Potassium: 4.8 mEq/L (ref 3.7–5.3)
SODIUM: 138 meq/L (ref 137–147)

## 2013-10-22 NOTE — Op Note (Signed)
[name], [name] NO.:  [id number]  MEDICAL RECORD NO.:  [id number]  LOCATION:  5N01C                        FACILITY:  [facility]  PHYSICIAN:  Kathy Albino. Carola Harvey, M.D. DATE OF BIRTH:  1950/11/06  DATE OF PROCEDURE:  10/21/2013 DATE OF DISCHARGE:                              OPERATIVE REPORT   PREOPERATIVE DIAGNOSES:  Right ankle chronic dislocation, syndesmotic nonunion, bimalleolar nonunion.  POSTOPERATIVE DIAGNOSES:  Right ankle chronic dislocation, syndesmotic nonunion, bimalleolar nonunion.  PROCEDURE:  Right tibiotalar calcaneal fusion/arthrodesis with Biomet fusion nail 11 x 180 mm statically locked.  SURGEON:  Kathy Albino. Carola Frost, MD  ASSISTANT:  Kathy Latin, PA-C  ANESTHESIA:  General.  COMPLICATIONS:  None.  TOURNIQUET:  None.  I/OS:  1000 mL crystalloid/350 mL UOP, EBL 100.  DISPOSITION:  To PACU.  CONDITION:  Stable.  BRIEF SUMMARY AND INDICATION FOR PROCEDURE:  Kathy Harvey is a 63 year old female with cerebral palsy, and a knee valgus who is walker dependent.  She sustained a bimalleolar ankle fracture with disruption of her syndesmosis in October of last year.  This was treated nonsurgically and went on to develop chronic subluxation, dislocation, nonunion, and widening of the syndesmosis.  I discussed with her the risks and benefits of surgical repair including the possibility of infection, nerve injury, vessel injury, failure to alleviate all of her pain, restore ambulatory function, need for further surgery, symptomatic hardware, heart attack, stroke, particularly given her comorbidities and many others.  She understood these risks and did wish to proceed with tibiotalar calcaneal fusion.  BRIEF SUMMARY OF PROCEDURE:  The patient was taken to the operating room after administration of preoperative antibiotics, appeared extensive.  A soft tissue work was then performed on the right foot removing a rather dense skin scaling along the  foot and the leg circumferentially.  This was to reduce the chance of infection.  Total procedure time for that was approximately 30 minutes.  I then performed a standard chlorhexidine scrub followed by Betadine scrub and paint.  The toes were prepped out of the field.  An anteromedial incision was made.  Dissection carried down to the medial gutter where extensive fibrous material was removed from the tibiotalar joint.  The medial malleolar nonunion was mobilized, and the medial malleolus essentially removed as it had poor integrity. Even with this debridement and good visualization of the tibiotalar joint, the talus could not be reduced under the tibia.  Consequently, a second lateral incision was made centered over the anterior aspect of the lateral malleolus.  Dissection was carried into the syndesmosis off the anterior aspect of the fibula allowing a third debridement of this area as well.  Once this was achieved, I was able to reduce the syndesmosis as well as the talus under the tibia.  Using a micro sagittal saw, the distal plafond and the talar dome were resected with matching surfaces in appropriate alignment.  My assistant then helped these reduce while appropriate starting position was obtained in the calcaneus and guide pin passed through the calcaneus across the talus into the tibia.  It was sequentially reamed after exchanging for the ball-tip guidewire.  A 180 mm length 11  mm diameter nail was inserted in appropriate depth.  Fixation secured in the talus and a dynamic slot additional static screw was placed into the tibial shaft.  The tibiotalar joint was then compressed through the device in addition to the compression and rotation with my assistant, Kathy Harvey.  This was followed by placement of additional screws into the calcaneus.  Final images showed appropriate reduction, hardware placement, trajectory and length.  Additionally, the graft that was harvested was then  taken to the syndesmosis where a syndesmotic fusion was performed removing the debris from the syndesmotic groove and then packing this area with cancellous and intramedullary graft.  The wounds were irrigated thoroughly and then closed in standard layered fashion with 2-0 Vicryl, 3-0 nylon.  Sterile gently compressive dressing was applied and a posterior stirrup splint.  The patient was awakened from anesthesia and transported to the PACU in stable condition.  Kathy MoritaKeith Paul, PA-C did again assisted throughout all portions of the procedure.  PROGNOSIS:  [name] will be nonweightbearing on the right lower extremity with unrestricted range of motion of the knee.  We expected to a transition graduated weightbearing in 8 weeks and we will plan to see her back in the office for removal of her sutures in 10-14 days.  She will be on DVT prophylaxis with Lovenox for the next 3 weeks or antiplatelet medications given her coronary vascular disease.     Kathy AlbinoMichael H. Carola FrostHandy, M.D.     MHH/MEDQ  D:  10/21/2013  T:  10/22/2013  Job:  [id number]

## 2013-10-22 NOTE — Progress Notes (Signed)
Orthopaedic Trauma [facility] Progress Note  Subjective  Doing ok this am No specific complaints Having pain but controlled with meds Eating breakfast   Review of Systems  Constitutional: Negative for fever and chills.  Respiratory: Negative for shortness of breath and wheezing.   Cardiovascular: Negative for chest pain and palpitations.  Gastrointestinal: Negative for nausea, vomiting and abdominal pain.  Neurological: Negative for headaches.     Objective   BP 105/47  Pulse 79  Temp(Src) 98.6 F (37 C) (Oral)  Resp 18  Ht 5\' 6"  (1.676 m)  Wt 99.791 kg (220 lb)  BMI 35.53 kg/m2  SpO2 96%  Intake/Output     01/27 0701 - 01/28 0700 01/28 0701 - 01/29 0700   P.O. 480    I.V. (mL/kg) 2550 (25.6)    IV Piggyback 50    Total Intake(mL/kg) 3080 (30.9)    Urine (mL/kg/hr) 2150 (0.9)    Blood 100 (0)    Total Output 2250     Net +830            Labs Results for Ellan LambertYORK, Teara C (MRN [id number]) as of 10/22/2013 08:09  Ref. Range 10/21/2013 14:40  WBC Latest Range: 4.0-10.5 K/uL 18.2 (H)  RBC Latest Range: 3.87-5.11 MIL/uL 4.07  Hemoglobin Latest Range: 12.0-15.0 g/dL 56.212.6  HCT Latest Range: 36.0-46.0 % 38.6  MCV Latest Range: 78.0-100.0 fL 94.8  MCH Latest Range: 26.0-34.0 pg 31.0  MCHC Latest Range: 30.0-36.0 g/dL 13.032.6  RDW Latest Range: 11.5-15.5 % 14.5  Platelets Latest Range: 150-400 K/uL 413 (H)    Exam  Gen: awake and alert, eating breakfast, NAD Lungs: clear anterior fields Cardiac: s1 and s2, reg Abd: + BS, NT Ext:       Right Lower Extremity  Dressing c/d/i  Ext warm  Sensation intact  Motor functions at baseline  No pain with passive stretch   Swelling controlled    Assessment and Plan   POD/HD#: 1   63 y/o female s/p repair of chronic ankle dislocation, syndesmosis nonunion and bimal nonunion   1. Repair of chronic ankle dislocation, syndesmosis nonunion and bimalleolar ankle nonunion s/p tibiotalocalcaneal fusion    NWB x 8  weeks  Splint x 2 weeks with conversion to CAM  PT/OT evals today   Mobility will be restricted due to cerebral palsy   Ice and elevate  2. Pain management:  Continue with current regimen  3. ABL anemia/Hemodynamics  Stable   4. DVT/PE prophylaxis:  Lovenox x 3 weeks  5. ID:   Completed periop abx  6. Metabolic Bone Disease:  Osteoporosis  Will check vitamin D levels to ensure it is appropriate  7. Activity:  As tolerated while maintaining NWB on R leg  8. FEN/Foley/Lines:  Advance diet as tolerated  Dc foley today  KVO IVF  9.Ex-fix/Splint care:  Do not get splint wet  Do not remove splint   10. Impediments to fracture healing:  Immobility/limited weightbearing   Chronic diseases  11. Medical issues  HTN- stable currently, have held Hyzaar  CP- continue with flexeril for spasticity   Hyperlipidemia- continue with home meds  GERT- Protonix    12. Dispo:  OT/PT consults  Pt likely stable for dc to SNF tomorrow, pt would like private room upon [facility]       Mearl LatinKeith W. Jaquitta Dupriest, PA-C [facility] [phone] (P) 10/22/2013 8:08 AM

## 2013-10-22 NOTE — Progress Notes (Signed)
OT Cancellation Note  Patient Details Name: Ellan Lambertellie C Detlefsen MRN: [id number] DOB: Dec 12, 1950   Cancelled Treatment:    Reason Eval/Treat Not Completed: Other (comment) PT from SNF, Returning to SNF. Defer OT to SNF. [facility]HILLARY Landrie Beale, OTR/L  [phone] 10/22/2013 10/22/2013, 4:53 PM

## 2013-10-22 NOTE — Care Management Note (Signed)
CARE MANAGEMENT NOTE 10/22/2013  Patient:  Kathy Harvey,Kathy Harvey   Account Number:  [id number]  Date Initiated:  10/22/2013  Documentation initiated by:  Vance PeperBRADY,Renaye Janicki  Subjective/Objective Assessment:   63 yr old female s/p right ankle fusion     Action/Plan:   [facility], will return at discharge. Social worker is aware.   Anticipated DC Date:  10/23/2013   Anticipated DC Plan:  SKILLED NURSING FACILITY  In-house referral  Clinical Social Worker      DC Planning Services  CM consult      [facility]  Resumption Of Svcs/PTA Provider   Choice offered to / List presented to:             Status of service:  Completed, signed off Medicare Important Message given?   (If response is "NO", the following Medicare IM given date fields will be blank) Date Medicare IM given:   Date Additional Medicare IM given:    Discharge Disposition:  SKILLED NURSING FACILITY

## 2013-10-22 NOTE — Evaluation (Signed)
Physical Therapy Evaluation Patient Details Name: Kathy Harvey MRN: [id number] DOB: [birth year] Today's Date: 10/22/2013 Time: 4098-1191 PT Time Calculation (min): 38 min  PT Assessment / Plan / Recommendation History of Present Illness  Pt is a 63 y/o female admitted s/p tibiotalar calcaneal fusion on the right. Pt is now NWB for 8 weeks. PMH includes CP. Prior to initial fall (06/2013) ot was ambulating with a RW. Prior to recent surgery, pt reports using a lift with PT to assist her in standing, and she could manage up to 30 seconds at a time.   Clinical Impression  This patient presents with acute pain and decreased functional independence following the above mentioned procedure. At the time of PT eval, pt was able to demonstrate bed mobility by rolling L and R for lift pad placement. Pt reports that she has been using a lift to transfer for the past 12 weeks, and has been working on standing (up to 30 seconds at a time) with a standing lift. Pt wishes to d/c back to skilled nursing to continue her rehab.      PT Assessment  Patient needs continued PT services    Follow Up Recommendations  SNF    Does the patient have the potential to tolerate intense rehabilitation      Barriers to Discharge        Equipment Recommendations  None recommended by PT    Recommendations for Other Services     Frequency Min 3X/week    Precautions / Restrictions Precautions Precautions: Fall Restrictions Weight Bearing Restrictions: Yes RLE Weight Bearing: Non weight bearing   Pertinent Vitals/Pain Pt reports 5/10 pain at rest. Pt positioned for comfort at end of session in recliner.       Mobility  Bed Mobility Overal bed mobility: Needs Assistance;+2 for physical assistance Bed Mobility: Rolling Rolling: Max assist;+2 for physical assistance General bed mobility comments: Hand-over-hand assist to grab bed rails. Able to initiate movement but requires +2 assist to roll all the way into  S/L.  Transfers Overall transfer level: Needs assistance General transfer comment: Maxi Move lift used to transfer pt from bed to chair.     Exercises General Exercises - Lower Extremity Ankle Circles/Pumps: 10 reps;Left Quad Sets: 10 reps;Right;Left Heel Slides: 10 reps;Left Hip ABduction/ADduction: 10 reps;Right;Left   PT Diagnosis: Difficulty walking;Acute pain  PT Problem List: Decreased strength;Decreased range of motion;Decreased activity tolerance;Decreased balance;Decreased mobility;Decreased knowledge of use of DME;Decreased safety awareness;Pain PT Treatment Interventions: DME instruction;Gait training;Stair training;Functional mobility training;Therapeutic activities;Therapeutic exercise;Neuromuscular re-education;Patient/family education     PT Goals(Current goals can be found in the care plan section) Acute Rehab PT Goals Patient Stated Goal: To return to skilled nursing for continued rehab PT Goal Formulation: With patient Time For Goal Achievement: 11/05/13 Potential to Achieve Goals: Good  Visit Information  Last PT Received On: 10/22/13 Assistance Needed: +2 History of Present Illness: Pt is a 64 y/o female admitted s/p tibiotalar calcaneal fusion on the right. Pt is now NWB for 8 weeks. PMH includes CP. Prior to initial fall (06/2013) ot was ambulating with a RW. Prior to recent surgery, pt reports using a lift with PT to assist her in standing, and she could manage up to 30 seconds at a time.        Prior Functioning  Home Living Family/patient expects to be discharged to:: Skilled nursing facility Prior Function Level of Independence: Needs assistance Communication Communication: No difficulties Dominant Hand: Left    Cognition  Cognition Arousal/Alertness:  Awake/alert Behavior During Therapy: WFL for tasks assessed/performed Overall Cognitive Status: Within Functional Limits for tasks assessed    Extremity/Trunk Assessment Upper Extremity  Assessment Upper Extremity Assessment: RUE deficits/detail RUE Deficits / Details: Pt reports wearing a splint on that hand for 4 hours a day. Pt reports fingers "locked up" during bed mobility and unable to grab bed rails for support with R side Lower Extremity Assessment Lower Extremity Assessment: Generalized weakness;RLE deficits/detail RLE Deficits / Details: Unable to wiggle toes at time of eval. With heavy concentration noted slight movement of big toe only.  RLE Sensation: decreased light touch Cervical / Trunk Assessment Cervical / Trunk Assessment: Kyphotic   Balance Balance Overall balance assessment: Needs assistance Sitting-balance support: Feet supported;Bilateral upper extremity supported Sitting balance-Leahy Scale: Fair  End of Session PT - End of Session Equipment Utilized During Treatment: Other (comment) (Maxi Move) Activity Tolerance: Patient tolerated treatment well Patient left: in chair;with call bell/phone within reach Nurse Communication: Mobility status;Need for lift equipment  GP     Ruthann CancerHamilton, [place] 10/22/2013, 11:50 AM  Ruthann CancerLaura Hamilton, PT, DPT [phone]

## 2013-10-22 NOTE — Progress Notes (Signed)
Clinical Social Work Department CLINICAL SOCIAL WORK PLACEMENT NOTE 10/22/2013  Patient:  Kathy Harvey,Kathy Harvey  Account Number:  [id number] Admit date:  10/21/2013  Clinical Social Worker:  Samuella BruinKRISTIN Jarmar Rousseau, Theresia MajorsLCSWA  Date/time:  10/22/2013 02:23 PM  Clinical Social Work is seeking post-discharge placement for this patient at the following level of care:   SKILLED NURSING   (*CSW will update this form in Epic as items are completed)   10/22/2013  [facility]family provided with [facility]'s list of facilities offering this level of care within the geographic area requested by the patient (or if unable, by the patient's family).  10/22/2013  Patient/family informed of their freedom to choose among providers that offer the needed level of care, that participate in Medicare, Medicaid or managed care program needed by the patient, have an available bed and are willing to accept the patient.    Patient/family informed of [facility]' ownership interest in [facility], as well as of the fact that they are under no obligation to receive care at this facility.  PASARR submitted to EDS on 10/22/2013 PASARR number received from [employer] on 10/22/2013  FL2 transmitted to all facilities in geographic area requested by pt/family on  10/22/2013 FL2 transmitted to all facilities within larger geographic area on   Patient informed that his/her managed care company has contracts with or will negotiate with  certain facilities, including the following:     Patient/family informed of bed offers received:   Patient chooses bed at  Physician recommends and patient chooses bed at    Patient to be transferred to  on   Patient to be transferred to facility by   The following physician request were entered in Epic:   Additional Comments:  Samuella BruinKristin Ahlaya Ende, MSW, LCSWA Clinical Social Worker [facility]. [phone]

## 2013-10-22 NOTE — Progress Notes (Signed)
Clinical Social Work Department BRIEF PSYCHOSOCIAL ASSESSMENT 10/22/2013  Patient:  Kathy Harvey, Kathy Harvey     Account Number:  [id number]     Admit date:  10/21/2013  Clinical Social Worker:  Su Monks  Date/Time:  10/22/2013 02:09 PM  Referred by:  Physician  Date Referred:  10/22/2013 Referred for  SNF Placement   Other Referral:   Interview type:  Patient Other interview type:    PSYCHOSOCIAL DATA Living Status:  FACILITY Admitted from facility:  [facility] Level of care:  [place] Primary support name:  Cassia Fein [phone] Primary support relationship to patient:  FAMILY Degree of support available:   Niece provides good level of support according to patient.    CURRENT CONCERNS Current Concerns  Post-Acute Placement   Other Concerns:    SOCIAL WORK ASSESSMENT / PLAN CSW met with patient to discuss patient's return to [facility] at discharge. Patient states that she would return to the facility and enjoys the rehab that she receives, but that she is not happy with her care. She states that she has gotten lice and the flu from other residents, and that the room she shares too small for comfort. Patient expressed itnerest in [facility] and gave permission for CSW to initate SNF search in [facility]. Patient has no further questions at this time and thanked CSW for assistance.   Assessment/plan status:  Information/Referral to [employer] Other assessment/ plan:   Information/referral to community resources:   CSW to [employer] in [place]. and provided patient with SNF listings.    PATIENT'S/FAMILY'S RESPONSE TO PLAN OF CARE: Patient states that she does not mind returning to [facility] but is not happy with the facility as a whole, though she likes the rehab department. If returning, patient would prefer a private room. Patient would prefer [facility], as she if  familiar with the facility. Patient is agreeable to returning to a SNF at discharge.     Tilden Fossa, MSW, [place] Clinical Social Worker [facility]. [phone]

## 2013-10-22 NOTE — Progress Notes (Signed)
UR completed. Eleni Frank RN CCM Case Mgmt 

## 2013-10-23 ENCOUNTER — Encounter (HOSPITAL_COMMUNITY): Payer: Self-pay | Admitting: Orthopedic Surgery

## 2013-10-23 DIAGNOSIS — S93439A Sprain of tibiofibular ligament of unspecified ankle, initial encounter: Secondary | ICD-10-CM | POA: Diagnosis present

## 2013-10-23 DIAGNOSIS — S9304XA Dislocation of right ankle joint, initial encounter: Secondary | ICD-10-CM

## 2013-10-23 HISTORY — DX: Dislocation of right ankle joint, initial encounter: S93.04XA

## 2013-10-23 HISTORY — DX: Sprain of tibiofibular ligament of unspecified ankle, initial encounter: S93.439A

## 2013-10-23 LAB — CBC
HEMATOCRIT: 32.4 % — AB (ref 36.0–46.0)
Hemoglobin: 10.6 g/dL — ABNORMAL LOW (ref 12.0–15.0)
MCH: 31.3 pg (ref 26.0–34.0)
MCHC: 32.7 g/dL (ref 30.0–36.0)
MCV: 95.6 fL (ref 78.0–100.0)
Platelets: 304 10*3/uL (ref 150–400)
RBC: 3.39 MIL/uL — AB (ref 3.87–5.11)
RDW: 14.7 % (ref 11.5–15.5)
WBC: 8.5 10*3/uL (ref 4.0–10.5)

## 2013-10-23 LAB — BASIC METABOLIC PANEL
BUN: 9 mg/dL (ref 6–23)
CALCIUM: 9.5 mg/dL (ref 8.4–10.5)
CHLORIDE: 103 meq/L (ref 96–112)
CO2: 24 mEq/L (ref 19–32)
Creatinine, Ser: 0.64 mg/dL (ref 0.50–1.10)
GFR calc Af Amer: >90 mL/min (ref >90)
GFR calc non Af Amer: >90 mL/min (ref >90)
Glucose, Bld: 107 mg/dL — ABNORMAL HIGH (ref 70–99)
Potassium: 4.9 mEq/L (ref 3.7–5.3)
SODIUM: 137 meq/L (ref 137–147)

## 2013-10-23 MED ORDER — OXYCODONE HCL 5 MG PO TABS: 5.0000 mg | ORAL_TABLET | ORAL | Status: AC | PRN

## 2013-10-23 MED ORDER — ENOXAPARIN SODIUM 40 MG/0.4ML ~~LOC~~ SOLN: 40.0000 mg | SUBCUTANEOUS | Status: AC

## 2013-10-23 MED ORDER — DSS 100 MG PO CAPS: 100.0000 mg | ORAL_CAPSULE | Freq: Two times a day (BID) | ORAL | Status: AC

## 2013-10-23 MED ORDER — OXYCODONE-ACETAMINOPHEN 5-325 MG PO TABS: 1.0000 | ORAL_TABLET | Freq: Four times a day (QID) | ORAL | Status: AC | PRN

## 2013-10-23 NOTE — Discharge Summary (Signed)
[facility] (OTS)  Patient ID: Kathy Harvey MRN: [id number] DOB/AGE: 65-Feb-1952 65 y.o.  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 Carola Frost  1. Right tibiotalar calcaneal fusion/arthrodesis with Biomet fusion nail 11 x 180 mm statically locked    Discharged Condition: good  Hospital Course:   Patient is a very pleasant 63 year old white female who presented to our office 3 months after sustaining a ground-level fall with a closed right ankle fracture dislocation. Patient was treated in [place] initially but due to complications with her fracture she was referred to our office for evaluation. She was found to have a chronic dislocation of her right ankle along with a nonunion of her right syndesmosis and nonunion of her right bimalleolar ankle fracture. Patient's clinical course is complicated by a history of CP with spasticity right upper and lower extremity. Multiple treatment options were reviewed with the patient and we determined that a tibiotalar calcaneal fusion with the best treatment option for her. The patient was in agreement and underwent the procedure described up above on 10/21/2013. Patient was admitted from the nursing facility on the day of surgery. Patient's hospital stay was uncomplicated. She tolerated the procedure very well. After surgery was transferred to the  orthopedic floor for continued observation, pain control and to begin therapies. On postoperative day #1 patient was started on Lovenox for DVT and PE prophylaxis. She began to work with physical therapy and occupational therapy. Her Foley was discontinued on postoperative day #1 as well. Patient's pain was well-controlled on postoperative day #1. She did require some breakthrough IV pain medication but a majority of her pain was controlled with orals. Patient continued to progress well over the next day. And on postoperative day #2 she was deemed to be stable for discharge back to skilled nursing facility. The patient will be nonweightbearing on her right lower extremity for the next 8 weeks. She will remain in her splint for the next 2 with conversion to a cam boot afterwards. She will work with physical therapy on a daily basis. On her eventual goal is to return home she was ambulatory with a walker prior to her ankle fracture.  Of note we did hold the patient's Hyzaar during the postoperative period as her blood pressures were fairly well-controlled. They will be resumed upon discharge to the skilled nursing facility. The remainder of her home medications were resumed during her hospital stay or they were substituted with formulary agents.  Bed not available on POD 2, pt continued to remain stable. No issues.  No new issues noted on POD 3. Remains stable for dc to snf   Consults: None  Significant Diagnostic Studies: labs:   Results for Kathy Harvey, Kathy Harvey (MRN [id number]) as of 10/23/2013 08:38  Ref. Range 10/23/2013 04:02  Sodium Latest Range: 137-147 mEq/L 137  Potassium Latest Range: 3.7-5.3 mEq/L 4.9  Chloride Latest Range: 96-112 mEq/L 103  CO2 Latest Range: 19-32 mEq/L 24  BUN Latest Range: 6-23 mg/dL  9  Creatinine Latest Range: 0.50-1.10 mg/dL 1.61  Calcium Latest Range: 8.4-10.5 mg/dL 9.5  GFR calc non Af Amer Latest Range: >90 mL/min >90  GFR calc Af Amer Latest Range: >90 mL/min >90   Glucose Latest Range: 70-99 mg/dL 096 (H)  WBC Latest Range: 4.0-10.5 K/uL 8.5  RBC Latest Range: 3.87-5.11 MIL/uL 3.39 (L)  Hemoglobin Latest Range: 12.0-15.0 g/dL 04.5 (L)  HCT Latest Range: 36.0-46.0 % 32.4 (L)  MCV Latest Range: 78.0-100.0 fL 95.6  MCH Latest Range: 26.0-34.0 pg 31.3  MCHC Latest Range: 30.0-36.0 g/dL 40.9  RDW Latest Range: 11.5-15.5 % 14.7  Platelets Latest Range: 150-400 K/uL 304   Results for Kathy Harvey, Kathy Harvey (MRN [id number]) as of 10/24/2013 08:28  Ref. Range 10/23/2013 09:40  Vit D, 25-Hydroxy Latest Range: 30-89 ng/mL 33    Treatments: IV hydration, antibiotics: Ancef, analgesia: acetaminophen, Morphine and oxycodone and Percocet, anticoagulation: LMW heparin, therapies: PT, OT, RN and SW and surgery: As above  Discharge Exam:      Orthopaedic Trauma Service Progress Note  Subjective  Doing well No issues No bed available yesterday at [facility] for dc today     Objective   BP 114/52  Pulse 78  Temp(Src) 98.1 F (36.7 C) (Oral)  Resp 16  Ht 5\' 6"  (1.676 m)  Wt 99.791 kg (220 lb)  BMI 35.53 kg/m2  SpO2 97%  Intake/Output     01/29 0701 - 01/30 0700 01/30 0701 - 01/31 0700    P.O. 240     I.V. (mL/kg) 243.3 (2.4)     Total Intake(mL/kg) 483.3 (4.8)     Urine (mL/kg/hr)      Total Output        Net +483.3            Urine Occurrence 3 x       Labs  Results for Kathy Harvey, Kathy Harvey (MRN [id number]) as of 10/24/2013 08:28   Ref. Range  10/23/2013 09:40   Vit D, 25-Hydroxy  Latest Range: 30-89 ng/mL  33      Exam  Gen: awake and alert, NAD, comfortable, pleasant Lungs: clear   Cardiac: RRR Abd: + BS, NTND Ext:        Right Lower Extremity               Dressing c/d/i             Ext warm             Sensation intact             Motor functions at baseline             No pain with passive stretch               Swelling controlled  Assessment and Plan    POD/HD#: 38  63 y/o female s/p repair of chronic ankle dislocation,  syndesmosis nonunion and bimal nonunion   1. 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 Pain management:  Continue with current regimen  3. ABL anemia/Hemodynamics             Stable   4. DVT/PE prophylaxis:             Lovenox x 3 weeks  5. ID:               Completed periop abx  6. Metabolic Bone Disease:             Osteoporosis             vitamin d levels low end of normal, will add Vitamin D2 50,000 IU x 8 weeks and recheck             Will also check PTH levels   7. Activity:             As tolerated while maintaining NWB on R leg  8. FEN/Foley/Lines:             Advance diet as tolerated             Dc foley today             DC IV  9.Ex-fix/Splint care:             Do not get splint wet             Do not remove splint              10. Impediments to fracture healing:             Immobility/limited weightbearing               Chronic diseases  11. Medical issues             HTN- stable currently, have held Hyzaar, resume meds upon discharge nursing facility             CP- continue with flexeril for spasticity               Hyperlipidemia- continue with home meds             GERT- Protonix                12. Dispo:             ready for discharge to SNF once bed available             Followup with orthopedics in 7-10 days   Mearl Latin, PA-C [facility] [phone] (P) 10/24/2013 8:28 AM      Disposition: Skilled nursing facility      Discharge Orders   Future Orders Complete By Expires   Call MD / Call 911  As directed    Comments:     If you experience chest pain or shortness of breath, CALL 911 and be transported to the hospital emergency room.  If you develope a fever above 101 F, pus (white drainage) or  increased drainage or redness at the wound, or calf pain, call your surgeon's office.   Constipation Prevention  As directed    Comments:     Drink plenty of fluids.  Prune juice may be helpful.  You may use a stool softener, such as Colace (over the counter) 100 mg twice a day.  Use MiraLax (over the counter) for constipation as needed.   Diet Carb Modified  As directed    Discharge instructions  As directed    Comments:     Orthopaedic Trauma Service Discharge  Instructions   General Discharge Instructions  WEIGHT BEARING STATUS: Nonweightbearing right lower extremity  RANGE OF MOTION/ACTIVITY: Range of motion as tolerated right knee and hip. Activity as tolerated while maintaining nonweightbearing on right leg. Daily therapy sessions  Diet: as you were eating previously.  Can use over the counter stool softeners and bowel preparations, such as Miralax, to help with bowel movements.  Narcotics can be constipating.  Be sure to drink plenty of fluids  STOP SMOKING OR USING NICOTINE PRODUCTS!!!!  As discussed nicotine severely impairs your body's ability to heal surgical and traumatic wounds but also impairs bone healing.  Wounds and bone heal by forming microscopic blood vessels (angiogenesis) and nicotine is a vasoconstrictor (essentially, shrinks blood vessels).  Therefore, if vasoconstriction occurs to these microscopic blood vessels they essentially disappear and are unable to deliver necessary nutrients to the healing tissue.  This is one modifiable factor that you can do to dramatically increase your chances of healing your injury.    (This means no smoking, no nicotine gum, patches, etc)  DO NOT USE NONSTEROIDAL ANTI-INFLAMMATORY DRUGS (NSAID'S)  Using products such as Advil (ibuprofen), Aleve (naproxen), Motrin (ibuprofen) for additional pain control during fracture healing can delay and/or prevent the healing response.  If you would like to take over the counter (OTC) medication,  Tylenol (acetaminophen) is ok.  However, some narcotic medications that are given for pain control contain acetaminophen as well. Therefore, you should not exceed more than 4000 mg of tylenol in a day if you do not have liver disease.  Also note that there are may OTC medicines, such as cold medicines and allergy medicines that my contain tylenol as well.  If you have any questions about medications and/or interactions please ask your doctor/PA or your pharmacist.   PAIN MEDICATION USE AND EXPECTATIONS  You have likely been given narcotic medications to help control your pain.  After a traumatic event that results in an fracture (broken bone) with or without surgery, it is ok to use narcotic pain medications to help control one's pain.  We understand that everyone responds to pain differently and each individual patient will be evaluated on a regular basis for the continued need for narcotic medications. Ideally, narcotic medication use should last no more than 6-8 weeks (coinciding with fracture healing).   As a patient it is your responsibility as well to monitor narcotic medication use and report the amount and frequency you use these medications when you come to your office visit.   We would also advise that if you are using narcotic medications, you should take a dose prior to therapy to maximize you participation.  IF YOU ARE ON NARCOTIC MEDICATIONS IT IS NOT PERMISSIBLE TO OPERATE A MOTOR VEHICLE (MOTORCYCLE/CAR/TRUCK/MOPED) OR HEAVY MACHINERY DO NOT MIX NARCOTICS WITH OTHER CNS (CENTRAL NERVOUS SYSTEM) DEPRESSANTS SUCH AS ALCOHOL       ICE AND ELEVATE INJURED/OPERATIVE EXTREMITY  Using ice and elevating the injured extremity above your heart can help with swelling and pain control.  Icing in a pulsatile fashion, such as 20 minutes on and 20 minutes off, can be followed.    Do not place ice directly on skin. Make sure there is a barrier between to skin and the ice pack.    Using frozen items  such as frozen peas works well as the conform nicely to the are that needs to be iced.  USE AN ACE WRAP OR TED HOSE FOR SWELLING CONTROL  In addition to icing and  elevation, Ace wraps or TED hose are used to help limit and resolve swelling.  It is recommended to use Ace wraps or TED hose until you are informed to stop.    When using Ace Wraps start the wrapping distally (farthest away from the body) and wrap proximally (closer to the body)   Example: If you had surgery on your leg or thing and you do not have a splint on, start the ace wrap at the toes and work your way up to the thigh        If you had surgery on your upper extremity and do not have a splint on, start the ace wrap at your fingers and work your way up to the upper arm  IF YOU ARE IN A SPLINT OR CAST DO NOT REMOVE IT FOR ANY REASON   If your splint gets wet for any reason please contact the office immediately. You may shower in your splint or cast as long as you keep it dry.  This can be done by wrapping in a cast cover or garbage back (or similar)  Do Not stick any thing down your splint or cast such as pencils, money, or hangers to try and scratch yourself with.  If you feel itchy take benadryl as prescribed on the bottle for itching  IF YOU ARE IN A CAM BOOT (BLACK BOOT)  You may remove boot periodically. Perform daily dressing changes as noted below.  Wash the liner of the boot regularly and wear a sock when wearing the boot. It is recommended that you sleep in the boot until told otherwise  CALL THE OFFICE WITH ANY QUESTIONS OR CONCERTS: [phone]     Discharge Pin Site Instructions  Dress pins daily with Kerlix roll starting on POD 2. Wrap the Kerlix so that it tamps the skin down around the pin-skin interface to prevent/limit motion of the skin relative to the pin.  (Pin-skin motion is the primary cause of pain and infection related to external fixator pin sites).  Remove any crust or coagulum that may obstruct  drainage with a saline moistened gauze or soap and water.  After POD 3, if there is no discernable drainage on the pin site dressing, the interval for change can by increased to every other day.  You may shower with the fixator, cleaning all pin sites gently with soap and water.  If you have a surgical wound this needs to be completely dry and without drainage before showering.  The extremity can be lifted by the fixator to facilitate wound care and transfers.  Notify the office/Doctor if you experience increasing drainage, redness, or pain from a pin site, or if you notice purulent (thick, snot-like) drainage.  Discharge Wound Care Instructions  Do NOT apply any ointments, solutions or lotions to pin sites or surgical wounds.  These prevent needed drainage and even though solutions like hydrogen peroxide kill bacteria, they also damage cells lining the pin sites that help fight infection.  Applying lotions or ointments can keep the wounds moist and can cause them to breakdown and open up as well. This can increase the risk for infection. When in doubt call the office.  Surgical incisions should be dressed daily.  If any drainage is noted, use one layer of adaptic, then gauze, Kerlix, and an ace wrap.  Once the incision is completely dry and without drainage, it may be left open to air out.  Showering may begin 36-48 hours later.  Cleaning gently with  soap and water.  Traumatic wounds should be dressed daily as well.    One layer of adaptic, gauze, Kerlix, then ace wrap.  The adaptic can be discontinued once the draining has ceased    If you have a wet to dry dressing: wet the gauze with saline the squeeze as much saline out so the gauze is moist (not soaking wet), place moistened gauze over wound, then place a dry gauze over the moist one, followed by Kerlix wrap, then ace wrap.   Increase activity slowly as tolerated  As directed    Non weight bearing  As directed    Questions:      Laterality:     Extremity:         Medication List    STOP taking these medications       azithromycin 500 MG tablet  Commonly known as:  ZITHROMAX     HYDROcodone-acetaminophen 10-325 MG per tablet  Commonly known as:  NORCO     HYDROcodone-acetaminophen 5-325 MG per tablet  Commonly known as:  NORCO/VICODIN     meloxicam 7.5 MG tablet  Commonly known as:  MOBIC      TAKE these medications       aspirin EC 81 MG tablet  Take 81 mg by mouth daily.     CALCIUM 600+D HIGH POTENCY 600-400 MG-UNIT per tablet  Generic drug:  Calcium Carbonate-Vitamin D  Take 2 tablets by mouth daily.     cyclobenzaprine 10 MG tablet  Commonly known as:  FLEXERIL  Take 10 mg by mouth 3 (three) times daily as needed for muscle spasms.     DSS 100 MG Caps  Take 100 mg by mouth 2 (two) times daily.     enoxaparin 40 MG/0.4ML injection  Commonly known as:  LOVENOX  Inject 0.4 mLs (40 mg total) into the skin daily.     EPIPEN 0.3 mg/0.3 mL Soaj injection  Generic drug:  EPINEPHrine  Inject 0.3 mg into the muscle as needed (allergic reaction).     Fish Oil 500 MG Caps  Take 500 mg by mouth daily.     loratadine 10 MG tablet  Commonly known as:  CLARITIN  Take 10 mg by mouth daily.     losartan-hydrochlorothiazide 100-12.5 MG per tablet  Commonly known as:  HYZAAR  Take 1 tablet by mouth daily.     lovastatin 40 MG tablet  Commonly known as:  MEVACOR  Take 40 mg by mouth at bedtime.     omeprazole 20 MG capsule  Commonly known as:  PRILOSEC  Take 20 mg by mouth daily.     oxyCODONE 5 MG immediate release tablet  Commonly known as:  Oxy IR/ROXICODONE  Take 1-2 tablets (5-10 mg total) by mouth every 3 (three) hours as needed for breakthrough pain (take between percocet).     oxyCODONE-acetaminophen 5-325 MG per tablet  Commonly known as:  PERCOCET/ROXICET  Take 1-2 tablets by mouth every 6 (six) hours as needed for moderate pain or severe pain.     polyethylene glycol packet   Commonly known as:  MIRALAX / GLYCOLAX  Take 17 g by mouth daily as needed (constipation).     Vitamin D (Ergocalciferol) 50000 UNITS Caps capsule  Commonly known as:  DRISDOL  Take 1 capsule (50,000 Units total) by mouth every 7 (seven) days.       Follow-up Information   Follow up with Budd Palmer, MD On 11/03/2013. (appointment is at 10:00 am)  Specialty:  Orthopedic Surgery   Contact information:   [address] [place] [address] [phone]       Discharge Instructions and Plan:  Patient has sustained a significant complication related to her closed ankle fracture with which she sustained back in October. We were able to address her problems of chronic syndesmotic disruption, chronic ankle dislocation, nonunion of her bimalleolar ankle fracture of the hindfoot fusion nail.  Patient will be nonweightbearing for the next 8 weeks She'll remain in her splint for the next 2 weeks and then we will convert her to her cam boot Patient will be discharged to the skilled nursing facility for closer supervision as she is unable to care for herself at home  We are hopeful that the patient will go on and unite uneventfully and will be able to return to her ambulatory preinjury function level with the use of her rolling walker.  Nursing facility does not need to worry about any wound care at this current time. They're to not remove the splint. If the splint is to get wet in her to contact the office immediately.  Patient resume her pre-admission diet  Patient will be on oxycodone, Percocet and Flexeril for pain control as well as spasm control  Patient should continue to ice and elevate her lower extremities much possible. I would like for the patient to be as active as possible within the parameters of her weight bearing restrictions  Should the nursing home have any questions or concerns whatsoever they're to contact the office.  Patient will be on Lovenox  for DVT and PE prophylaxis for the next 3 weeks as well.  Pts vitamin D levels are low end of normal.  Will add vitamin D 2 50,000 IU weekly x 8 weeks and then recheck labs. Need to optimize bone healing potential.  PTH levels pending.  Pt was on fosamax prior to our evaluation in the office which likely hindered the healing process.  Would strongly consider Forteo therapy after discharge as well.  Pt will need DEXA as outpatient as well  Patient will followup in our office on 11/03/2013 at 10 AM at which time we'll remove her splint, obtain followup x-rays and remove her sutures.  Signed:  Mearl LatinKeith W. Dysen Edmondson, PA-C [facility] [phone] (P) 10/24/2013, 8:47 AM

## 2013-10-23 NOTE — Discharge Instructions (Signed)
[facility] Discharge Instructions   General Discharge Instructions  WEIGHT BEARING STATUS: Nonweightbearing right lower extremity  RANGE OF MOTION/ACTIVITY: Range of motion as tolerated right knee and hip. Activity as tolerated while maintaining nonweightbearing on right leg. Daily therapy sessions  Diet: as you were eating previously.  Can use over the counter stool softeners and bowel preparations, such as Miralax, to help with bowel movements.  Narcotics can be constipating.  Be sure to drink plenty of fluids  STOP SMOKING OR USING NICOTINE PRODUCTS!!!!  As discussed nicotine severely impairs your body's ability to heal surgical and traumatic wounds but also impairs bone healing.  Wounds and bone heal by forming microscopic blood vessels (angiogenesis) and nicotine is a vasoconstrictor (essentially, shrinks blood vessels).  Therefore, if vasoconstriction occurs to these microscopic blood vessels they essentially disappear and are unable to deliver necessary nutrients to the healing tissue.  This is one modifiable factor that you can do to dramatically increase your chances of healing your injury.    (This means no smoking, no nicotine gum, patches, etc)  DO NOT USE NONSTEROIDAL ANTI-INFLAMMATORY DRUGS (NSAID'S)  Using products such as Advil (ibuprofen), Aleve (naproxen), Motrin (ibuprofen) for additional pain control during fracture healing can delay and/or prevent the healing response.  If you would like to take over the counter (OTC) medication, Tylenol (acetaminophen) is ok.  However, some narcotic medications that are given for pain control contain acetaminophen as well. Therefore, you should not exceed more than 4000 mg of tylenol in a day if you do not have liver disease.  Also note that there are may OTC medicines, such as cold medicines and allergy medicines that my contain tylenol as well.  If you have any questions about medications and/or interactions please ask your  doctor/PA or your pharmacist.   PAIN MEDICATION USE AND EXPECTATIONS  You have likely been given narcotic medications to help control your pain.  After a traumatic event that results in an fracture (broken bone) with or without surgery, it is ok to use narcotic pain medications to help control one's pain.  We understand that everyone responds to pain differently and each individual patient will be evaluated on a regular basis for the continued need for narcotic medications. Ideally, narcotic medication use should last no more than 6-8 weeks (coinciding with fracture healing).   As a patient it is your responsibility as well to monitor narcotic medication use and report the amount and frequency you use these medications when you come to your office visit.   We would also advise that if you are using narcotic medications, you should take a dose prior to therapy to maximize you participation.  IF YOU ARE ON NARCOTIC MEDICATIONS IT IS NOT PERMISSIBLE TO OPERATE A MOTOR VEHICLE (MOTORCYCLE/CAR/TRUCK/MOPED) OR HEAVY MACHINERY DO NOT MIX NARCOTICS WITH OTHER CNS (CENTRAL NERVOUS SYSTEM) DEPRESSANTS SUCH AS ALCOHOL       ICE AND ELEVATE INJURED/OPERATIVE EXTREMITY  Using ice and elevating the injured extremity above your heart can help with swelling and pain control.  Icing in a pulsatile fashion, such as 20 minutes on and 20 minutes off, can be followed.    Do not place ice directly on skin. Make sure there is a barrier between to skin and the ice pack.    Using frozen items such as frozen peas works well as the conform nicely to the are that needs to be iced.  USE AN ACE WRAP OR TED HOSE FOR SWELLING CONTROL  In addition  to icing and elevation, Ace wraps or TED hose are used to help limit and resolve swelling.  It is recommended to use Ace wraps or TED hose until you are informed to stop.    When using Ace Wraps start the wrapping distally (farthest away from the body) and wrap proximally (closer to  the body)   Example: If you had surgery on your leg or thing and you do not have a splint on, start the ace wrap at the toes and work your way up to the thigh        If you had surgery on your upper extremity and do not have a splint on, start the ace wrap at your fingers and work your way up to the upper arm  IF YOU ARE IN A SPLINT OR CAST DO NOT REMOVE IT FOR ANY REASON   If your splint gets wet for any reason please contact the office immediately. You may shower in your splint or cast as long as you keep it dry.  This can be done by wrapping in a cast cover or garbage back (or similar)  Do Not stick any thing down your splint or cast such as pencils, money, or hangers to try and scratch yourself with.  If you feel itchy take benadryl as prescribed on the bottle for itching  IF YOU ARE IN A CAM BOOT (BLACK BOOT)  You may remove boot periodically. Perform daily dressing changes as noted below.  Wash the liner of the boot regularly and wear a sock when wearing the boot. It is recommended that you sleep in the boot until told otherwise  CALL THE OFFICE WITH ANY QUESTIONS OR CONCERTS: [phone]     Discharge Pin Site Instructions  Dress pins daily with Kerlix roll starting on POD 2. Wrap the Kerlix so that it tamps the skin down around the pin-skin interface to prevent/limit motion of the skin relative to the pin.  (Pin-skin motion is the primary cause of pain and infection related to external fixator pin sites).  Remove any crust or coagulum that may obstruct drainage with a saline moistened gauze or soap and water.  After POD 3, if there is no discernable drainage on the pin site dressing, the interval for change can by increased to every other day.  You may shower with the fixator, cleaning all pin sites gently with soap and water.  If you have a surgical wound this needs to be completely dry and without drainage before showering.  The extremity can be lifted by the fixator to facilitate  wound care and transfers.  Notify the office/Doctor if you experience increasing drainage, redness, or pain from a pin site, or if you notice purulent (thick, snot-like) drainage.  Discharge Wound Care Instructions  Do NOT apply any ointments, solutions or lotions to pin sites or surgical wounds.  These prevent needed drainage and even though solutions like hydrogen peroxide kill bacteria, they also damage cells lining the pin sites that help fight infection.  Applying lotions or ointments can keep the wounds moist and can cause them to breakdown and open up as well. This can increase the risk for infection. When in doubt call the office.  Surgical incisions should be dressed daily.  If any drainage is noted, use one layer of adaptic, then gauze, Kerlix, and an ace wrap.  Once the incision is completely dry and without drainage, it may be left open to air out.  Showering may begin 36-48 hours later.  Cleaning gently with soap and water.  Traumatic wounds should be dressed daily as well.    One layer of adaptic, gauze, Kerlix, then ace wrap.  The adaptic can be discontinued once the draining has ceased    If you have a wet to dry dressing: wet the gauze with saline the squeeze as much saline out so the gauze is moist (not soaking wet), place moistened gauze over wound, then place a dry gauze over the moist one, followed by Kerlix wrap, then ace wrap.

## 2013-10-23 NOTE — Progress Notes (Signed)
[facility] Progress Note  Subjective  Doing well today Pain control Tolerating diet Voiding without difficulty No other complaints noted ready to discharge to SNF   Review of Systems  Constitutional: Negative for fever and chills.  Respiratory: Negative for sputum production and wheezing.   Cardiovascular: Negative for chest pain and palpitations.  Gastrointestinal: Negative for nausea, vomiting and abdominal pain.  Genitourinary: Negative for dysuria.  Neurological: Negative for headaches.     Objective   BP 128/69  Pulse 85  Temp(Src) 98.7 F (37.1 C) (Oral)  Resp 20  Ht 5\' 6"  (1.676 m)  Wt 99.791 kg (220 lb)  BMI 35.53 kg/m2  SpO2 96%  Intake/Output     01/28 0701 - 01/29 0700 01/29 0701 - 01/30 0700   P.O. 1440    I.V. (mL/kg) 1085 (10.9)    IV Piggyback     Total Intake(mL/kg) 2525 (25.3)    Urine (mL/kg/hr) 2 (0)    Blood     Total Output 2     Net +2523          Urine Occurrence 3 x      Labs Results for Ellan LambertYORK, Aliana C (MRN [id number]) as of 10/23/2013 08:38  Ref. Range 10/23/2013 04:02  Sodium Latest Range: 137-147 mEq/L 137  Potassium Latest Range: 3.7-5.3 mEq/L 4.9  Chloride Latest Range: 96-112 mEq/L 103  CO2 Latest Range: 19-32 mEq/L 24  BUN Latest Range: 6-23 mg/dL 9  Creatinine Latest Range: 0.50-1.10 mg/dL 6.210.64  Calcium Latest Range: 8.4-10.5 mg/dL 9.5  GFR calc non Af Amer Latest Range: >90 mL/min >90  GFR calc Af Amer Latest Range: >90 mL/min >90  Glucose Latest Range: 70-99 mg/dL 308107 (H)  WBC Latest Range: 4.0-10.5 K/uL 8.5  RBC Latest Range: 3.87-5.11 MIL/uL 3.39 (L)  Hemoglobin Latest Range: 12.0-15.0 g/dL 65.710.6 (L)  HCT Latest Range: 36.0-46.0 % 32.4 (L)  MCV Latest Range: 78.0-100.0 fL 95.6  MCH Latest Range: 26.0-34.0 pg 31.3  MCHC Latest Range: 30.0-36.0 g/dL 84.632.7  RDW Latest Range: 11.5-15.5 % 14.7  Platelets Latest Range: 150-400 K/uL 304     Exam  NGE:XBMWUGen:Awake and alert, no acute distress  Lungs:Clear  bilaterally  Cardiac:S1 and S2, regular rate and rhythm  XLK:GMWNAbd:Soft, nontender, nondistended, + bowel sounds  Ext:       Right Lower Extremity             Dressing c/d/i             Ext warm             Sensation intact             Motor functions at baseline             No pain with passive stretch               Swelling controlled      Assessment and Plan   POD/HD#: 2  63 y/o female s/p repair of chronic ankle dislocation, syndesmosis nonunion and bimal nonunion   1. 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 Pain management:             Continue with current regimen  3. ABL anemia/Hemodynamics             Stable   4. DVT/PE prophylaxis:             Lovenox x 3 weeks  5. ID:               Completed periop abx  6. Metabolic Bone Disease:             Osteoporosis         check vitamin D levels 7. Activity:             As tolerated while maintaining NWB on R leg  8. FEN/Foley/Lines:             Advance diet as tolerated             Dc foley today             DC IV  9.Ex-fix/Splint care:             Do not get splint wet             Do not remove splint              10. Impediments to fracture healing:             Immobility/limited weightbearing               Chronic diseases  11. Medical issues             HTN- stable currently, have held Hyzaar, resume meds upon discharge nursing facility             CP- continue with flexeril for spasticity               Hyperlipidemia- continue with home meds             GERT- Protonix                12. Dispo:             ready for discharge to SNF once bed available  Followup with orthopedics in 7-10 days    Mearl Latin, PA-C [facility] [phone] (P) 10/23/2013 8:36 AM

## 2013-10-24 ENCOUNTER — Encounter (HOSPITAL_COMMUNITY): Payer: Self-pay | Admitting: Orthopedic Surgery

## 2013-10-24 DIAGNOSIS — I872 Venous insufficiency (chronic) (peripheral): Secondary | ICD-10-CM

## 2013-10-24 DIAGNOSIS — E559 Vitamin D deficiency, unspecified: Secondary | ICD-10-CM

## 2013-10-24 HISTORY — DX: Vitamin D deficiency, unspecified: E55.9

## 2013-10-24 HISTORY — DX: Venous insufficiency (chronic) (peripheral): I87.2

## 2013-10-24 LAB — BASIC METABOLIC PANEL
BUN: 7 mg/dL (ref 6–23)
CO2: 25 mEq/L (ref 19–32)
CREATININE: 0.64 mg/dL (ref 0.50–1.10)
Calcium: 9.1 mg/dL (ref 8.4–10.5)
Chloride: 103 mEq/L (ref 96–112)
GFR calc non Af Amer: >90 mL/min (ref >90)
Glucose, Bld: 103 mg/dL — ABNORMAL HIGH (ref 70–99)
Potassium: 4.2 mEq/L (ref 3.7–5.3)
Sodium: 141 mEq/L (ref 137–147)

## 2013-10-24 LAB — VITAMIN D 25 HYDROXY (VIT D DEFICIENCY, FRACTURES): VIT D 25 HYDROXY: 33 ng/mL (ref 30–89)

## 2013-10-24 MED ORDER — VITAMIN D (ERGOCALCIFEROL) 1.25 MG (50000 UNIT) PO CAPS
50000.0000 [IU] | ORAL_CAPSULE | ORAL | Status: DC
  Administered 2013-10-24: 50000 [IU] via ORAL
  Filled 2013-10-24 (×2): qty 1

## 2013-10-24 MED ORDER — VITAMIN D (ERGOCALCIFEROL) 1.25 MG (50000 UNIT) PO CAPS: 50000.0000 [IU] | ORAL_CAPSULE | ORAL | Status: AC

## 2013-10-24 NOTE — Progress Notes (Signed)
[facility]opaedic [facility]uma [facility] Progress Note  Subjective  Doing well No issues No bed available yesterday at [facility] for dc today     Objective   BP 114/52  Pulse 78  Temp(Src) 98.1 F (36.7 C) (Oral)  Resp 16  Ht 5\' 6"  (1.676 m)  Wt 99.791 kg (220 lb)  BMI 35.53 kg/m2  SpO2 97%  Intake/Output     01/29 0701 - 01/30 0700 01/30 0701 - 01/31 0700   P.O. 240    I.V. (mL/kg) 243.3 (2.4)    Total Intake(mL/kg) 483.3 (4.8)    Urine (mL/kg/hr)     Total Output       Net +483.3          Urine Occurrence 3 x      Labs  Results for CASHA, ESTUPINAN (MRN [id number]) as of 10/24/2013 08:28  Ref. Range 10/23/2013 09:40  Vit D, 25-Hydroxy Latest Range: 30-89 ng/mL 33     Exam  Gen: awake and alert, NAD, comfortable, pleasant Lungs: clear  Cardiac: RRR Abd: + BS, NTND Ext:       Right Lower Extremity   Dressing c/d/i             Ext warm             Sensation intact             Motor functions at baseline             No pain with passive stretch               Swelling controlled  Assessment and Plan    POD/HD#: 36  63 y/o female s/p repair of chronic ankle dislocation, syndesmosis nonunion and bimal nonunion   1. 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 Pain management:             Continue with current regimen  3. ABL anemia/Hemodynamics             Stable   4. DVT/PE prophylaxis:             Lovenox x 3 weeks  5. ID:               Completed periop abx  6. Metabolic Bone Disease:             Osteoporosis             vitamin d levels low end of normal, will add Vitamin D2 50,000 IU x 8 weeks and recheck  Will also check PTH levels   7. Activity:             As tolerated while maintaining NWB on R leg  8. FEN/Foley/Lines:  Advance diet as tolerated             Dc foley today             DC IV  9.Ex-fix/Splint care:             Do not get splint wet  Do not remove splint              10. Impediments to fracture healing:             Immobility/limited weightbearing               Chronic diseases  11. Medical issues             HTN- stable currently, have held Hyzaar, resume meds upon discharge nursing facility             CP- continue with flexeril for spasticity               Hyperlipidemia- continue with home meds             GERT- Protonix                12. Dispo:             ready for discharge to SNF once bed available             Followup with orthopedics in 7-10 days   Mearl LatinKeith W. Tyliyah Mcmeekin, PA-C [facility] [phone] (P) 10/24/2013 8:28 AM

## 2013-10-27 LAB — PTH, INTACT AND CALCIUM
Calcium, Total (PTH): 9.1 mg/dL (ref 8.4–10.5)
PTH: 30 pg/mL (ref 14.0–72.0)

## 2013-10-28 LAB — VITAMIN D 1,25 DIHYDROXY
VITAMIN D3 1, 25 (OH): 27 pg/mL
Vitamin D 1, 25 (OH)2 Total: 27 pg/mL (ref 18–72)

## 2014-08-24 IMAGING — CR DG ANKLE PORT 2V*R*
3 series · 3 of 3 positions shown · non-contrast
Comparison: DG ANKLE COMPLETE*R* dated 10/21/2013

CLINICAL DATA: Postop evaluation status post ankle arthrodesis

EXAM:
PORTABLE RIGHT ANKLE - 2 VIEW

[AP]
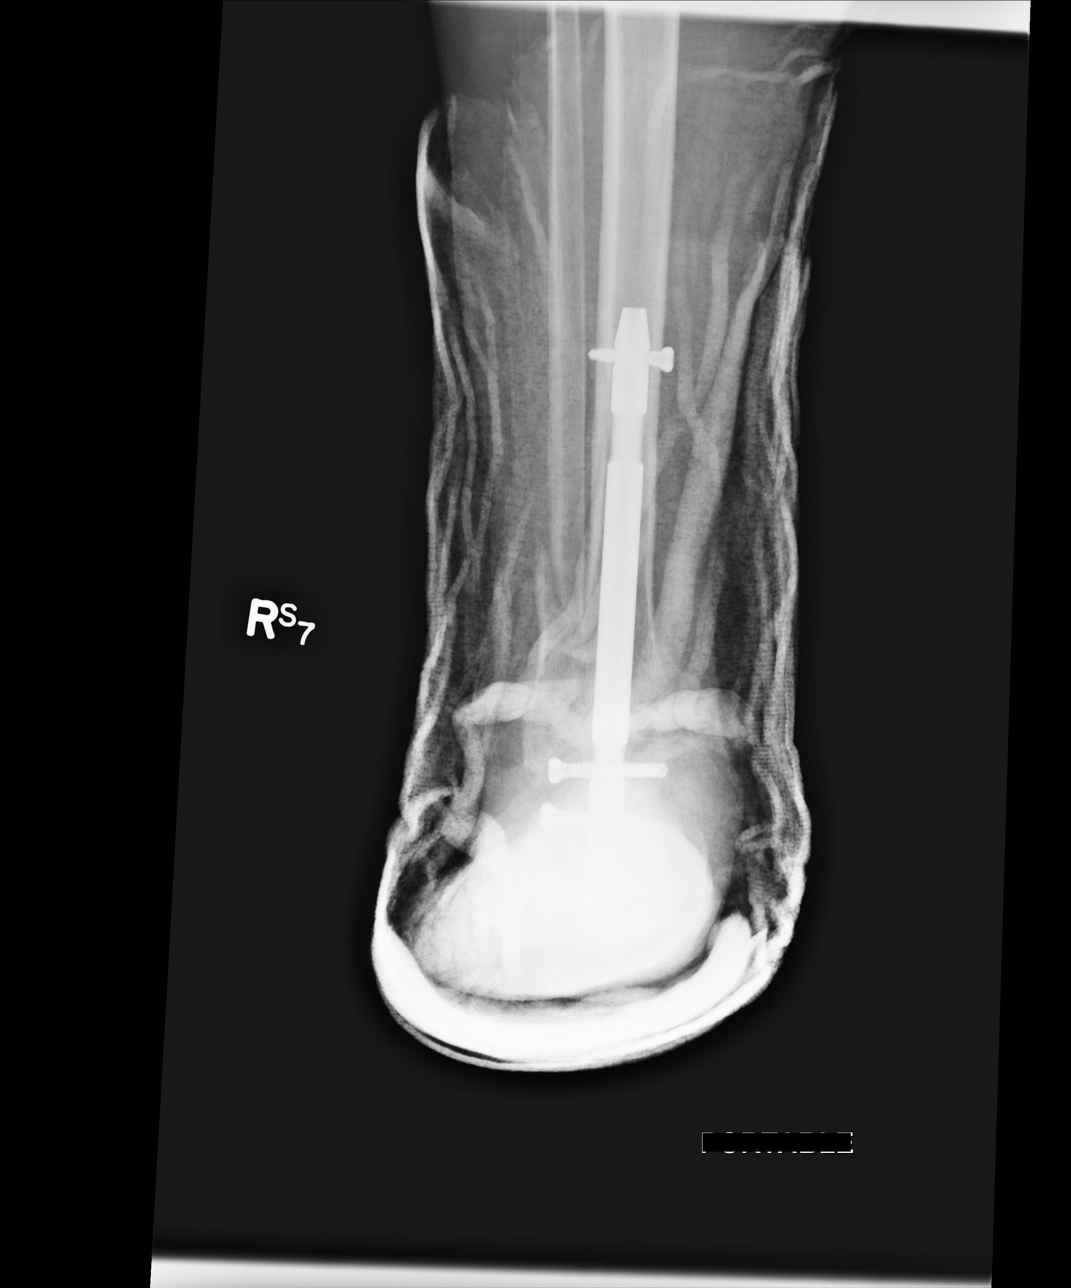

[lateral]
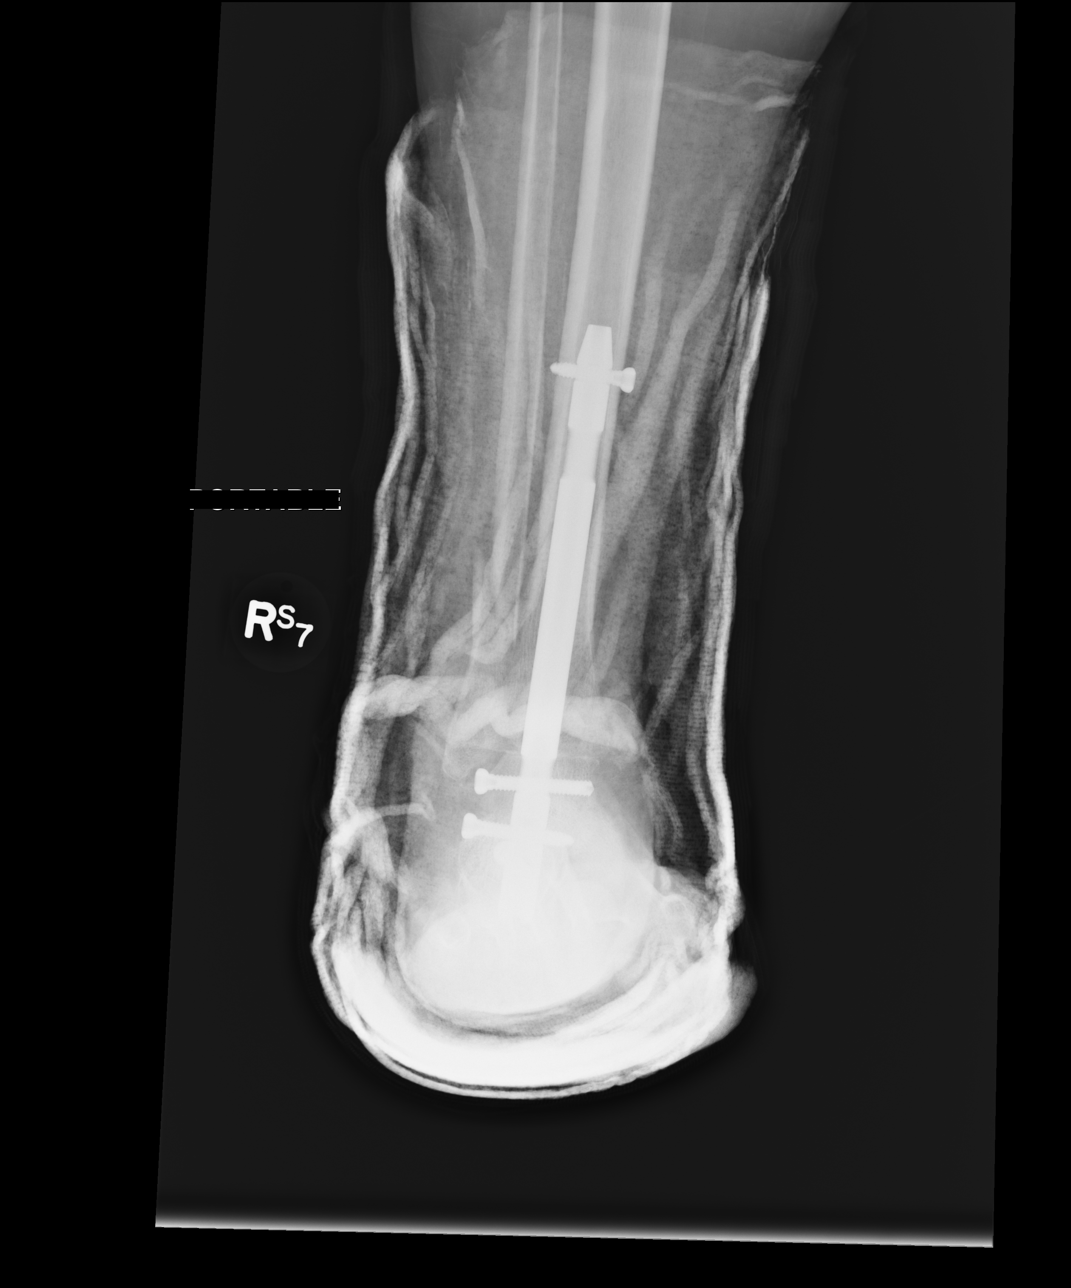

[ap obl int rot]
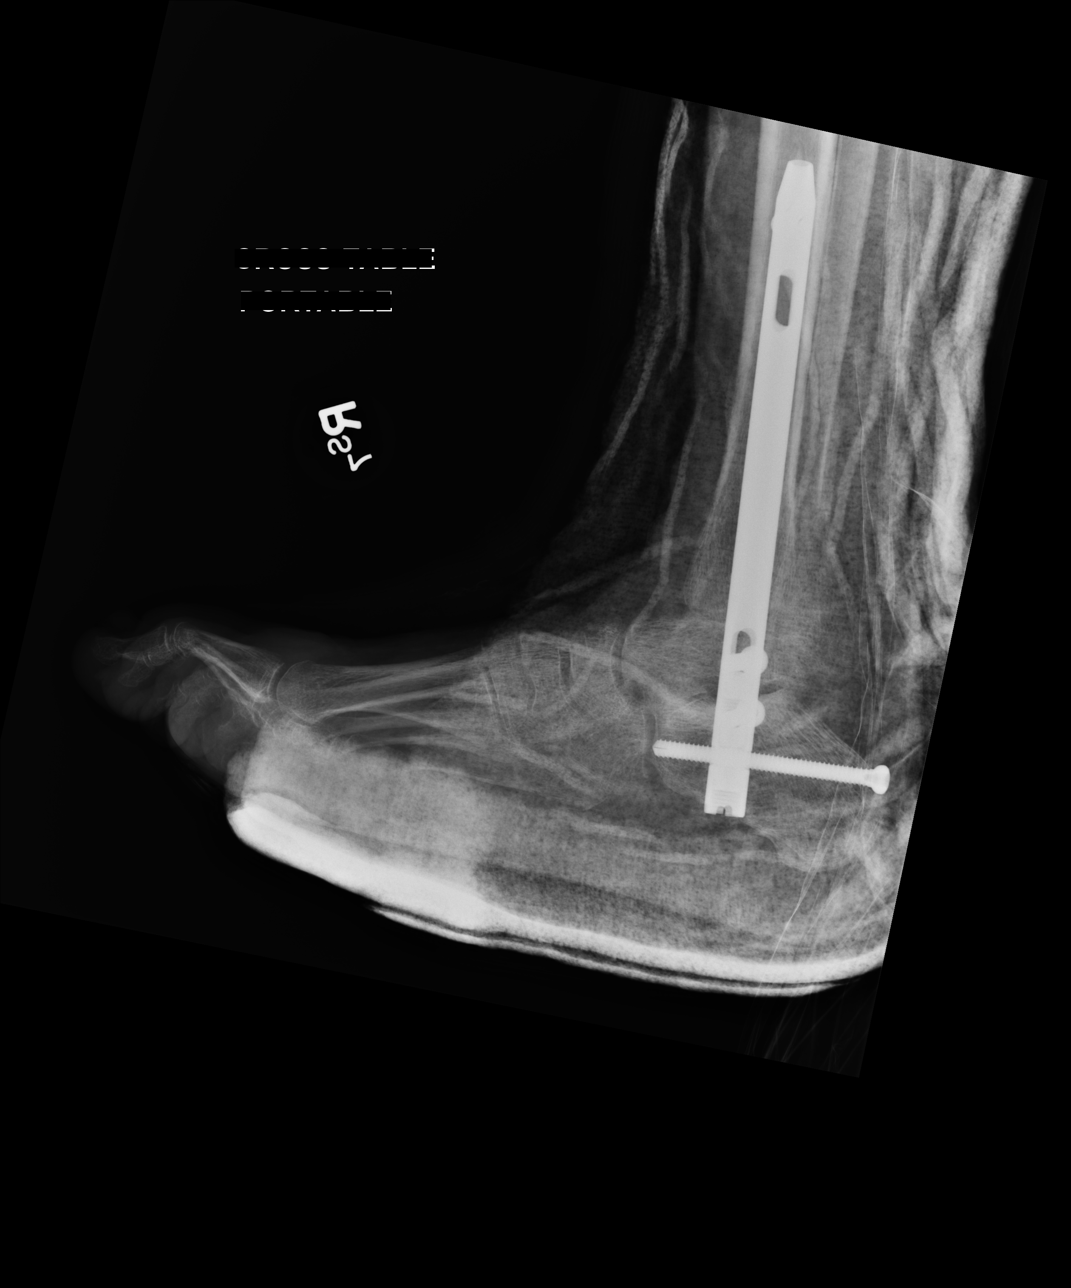

[3 of 3 positions shown; findings below may reference images not displayed]

FINDINGS: Patient is status post intra medullary rod and screw fixation of the
distal tibia, talus, and calcaneus. Hardware appears intact without
evidence of loosening nor failure. Limited evaluation of the ankle
mortise is grossly unremarkable. A comminuted oblique fracture is
appreciated along the distal fibular shaft.
IMPRESSION: Patient is status post ankle and subtalar arthrodesis.

## 2016-04-20 ENCOUNTER — Encounter: Payer: Self-pay | Admitting: Sports Medicine

## 2016-04-20 ENCOUNTER — Ambulatory Visit (INDEPENDENT_AMBULATORY_CARE_PROVIDER_SITE_OTHER): Payer: Medicare Other | Admitting: Sports Medicine

## 2016-04-20 DIAGNOSIS — E1142 Type 2 diabetes mellitus with diabetic polyneuropathy: Secondary | ICD-10-CM | POA: Diagnosis not present

## 2016-04-20 DIAGNOSIS — B351 Tinea unguium: Secondary | ICD-10-CM

## 2016-04-20 DIAGNOSIS — I872 Venous insufficiency (chronic) (peripheral): Secondary | ICD-10-CM

## 2016-04-20 DIAGNOSIS — M79674 Pain in right toe(s): Secondary | ICD-10-CM

## 2016-04-20 DIAGNOSIS — M79675 Pain in left toe(s): Secondary | ICD-10-CM | POA: Diagnosis not present

## 2016-04-20 NOTE — Progress Notes (Signed)
Subjective: Kathy Harvey is a 65 y.o. female patient with history of diabetes who presents to office today complaining of long, painful nails; unable to trim. Patient  Is assisted by facility care giver. Patient states that the glucose reading this morning was not recorded normally gets her blood checked every 3 months. States that she has been in a care home since 2 years ago after she fractured her Right ankle. Patient denies any new changes in medication or new problems. Patient denies any new cramping, numbness, burning or tingling in the legs.  Patient Active Problem List   Diagnosis Date Noted  . Vitamin D insufficiency 10/24/2013  . Chronic venous insufficiency 10/24/2013  . Ankle syndesmosis disruption 10/23/2013  . Dislocation of right ankle joint 10/23/2013  . Hypertension   . GERD (gastroesophageal reflux disease)   . Cerebral palsy (HCC)   . Osteoporosis   . 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 aspirin EC 81 MG tablet Take 81 mg by mouth daily.    . Calcium Carbonate-Vitamin D (CALCIUM 600+D HIGH POTENCY) 600-400 MG-UNIT per tablet Take 2 tablets by mouth daily.    . cyclobenzaprine (FLEXERIL) 10 MG tablet Take 10 mg by mouth 3 (three) times daily as needed for muscle spasms.    Marland Kitchen docusate sodium 100 MG CAPS Take 100 mg by mouth 2 (two) times daily. 30 capsule 0  . enoxaparin (LOVENOX) 40 MG/0.4ML injection Inject 0.4 mLs (40 mg total) into the skin daily. 21 Syringe 0  . EPINEPHrine (EPIPEN) 0.3 mg/0.3 mL SOAJ injection Inject 0.3 mg into the muscle as needed (allergic reaction).    Marland Kitchen loratadine (CLARITIN) 10 MG tablet Take 10 mg by mouth daily.    Marland Kitchen losartan-hydrochlorothiazide (HYZAAR) 100-12.5 MG per tablet Take 1 tablet by mouth daily.    Marland Kitchen lovastatin (MEVACOR) 40 MG tablet Take 40 mg by mouth at bedtime.    . Omega-3 Fatty Acids (FISH OIL) 500 MG CAPS Take 500 mg by mouth  daily.    Marland Kitchen omeprazole (PRILOSEC) 20 MG capsule Take 20 mg by mouth daily.    Marland Kitchen oxyCODONE (OXY IR/ROXICODONE) 5 MG immediate release tablet Take 1-2 tablets (5-10 mg total) by mouth every 3 (three) hours as needed for breakthrough pain (take between percocet). 60 tablet 0  . oxyCODONE-acetaminophen (PERCOCET/ROXICET) 5-325 MG per tablet Take 1-2 tablets by mouth every 6 (six) hours as needed for moderate pain or severe pain. 90 tablet 0  . polyethylene glycol (MIRALAX / GLYCOLAX) packet Take 17 g by mouth daily as needed (constipation).    . Vitamin D, Ergocalciferol, (DRISDOL) 50000 UNITS CAPS capsule Take 1 capsule (50,000 Units total) by mouth every 7 (seven) days. 8 capsule 1   No current facility-administered medications on file prior to visit.    Allergies  Allergen Reactions  . Bacitracin Other (See Comments)    Per MAR  . Bee Venom Other (See Comments)    Per MAR  . Erythromycin Base Other (See Comments)    Per MAR  . Neomycin Other (See Comments)    Per MAR  . Polymyxin B Other (See Comments)    Per MAR    No results found for this or any previous visit (from the past 2160 hour(s)).  Objective: General: Patient is awake, alert, and oriented x 3 and in no acute distress in wheelchair.   Integument: Skin is cool, dry,  and supple bilateral. Nails are tender, long, thickened and dystrophic with subungual debris, consistent with onychomycosis, 1-5 bilateral. No signs of infection. No open lesions or preulcerative lesions present bilateral. Remaining integument unremarkable.  Vasculature:  Dorsalis Pedis pulse 0/4 bilateral. Posterior Tibial pulse  0/4 bilateral. Capillary fill time <5 sec 1-5 bilateral. No hair growth to the level of the digits.Temperature gradient decreased bilateral. + varicosities present bilateral. 1+ pitting edema present bilateral and blue discoloration to feet and legs.  Neurology: The patient has intact sensation measured with a 5.07/10g Semmes Weinstein  Monofilament at all pedal sites bilateral . Vibratory sensation diminished bilateral with tuning fork. No Babinski sign present bilateral.   Musculoskeletal: No symptomatic pedal deformities noted bilateral. Muscular strength 4/5 in all lower extremity muscular groups bilateral without pain on range of motion. Limitation on right ankle due to previous ankle fracture history. No tenderness with calf compression bilateral.  Assessment and Plan: Problem List Items Addressed This Visit      Cardiovascular and Mediastinum   Chronic venous insufficiency (Chronic)    Other Visit Diagnoses    Dermatophytosis of nail    -  Primary   Toe pain, bilateral       Diabetic polyneuropathy associated with type 2 diabetes mellitus (HCC)         -Examined patient. -Discussed and educated patient on diabetic foot care, especially with  regards to the vascular, neurological and musculoskeletal systems.  -Stressed the importance of good glycemic control and the detriment of not  controlling glucose levels in relation to the foot. -Mechanically debrided all nails 1-5 bilateral using sterile nail nipper and filed with dremel without incident  -Recommend elevation when sitting to assist with edema control -Answered all patient questions -Patient to return  in 3 months for at risk foot care -Patient advised to call the office if any problems or questions arise in the meantime.  Asencion Islam, DPM

## 2016-07-21 ENCOUNTER — Ambulatory Visit (INDEPENDENT_AMBULATORY_CARE_PROVIDER_SITE_OTHER): Payer: Medicare Other | Admitting: Sports Medicine

## 2016-07-21 ENCOUNTER — Encounter: Payer: Self-pay | Admitting: Sports Medicine

## 2016-07-21 DIAGNOSIS — I872 Venous insufficiency (chronic) (peripheral): Secondary | ICD-10-CM | POA: Diagnosis not present

## 2016-07-21 DIAGNOSIS — M79674 Pain in right toe(s): Secondary | ICD-10-CM

## 2016-07-21 DIAGNOSIS — E1142 Type 2 diabetes mellitus with diabetic polyneuropathy: Secondary | ICD-10-CM | POA: Diagnosis not present

## 2016-07-21 DIAGNOSIS — B351 Tinea unguium: Secondary | ICD-10-CM

## 2016-07-21 DIAGNOSIS — M79676 Pain in unspecified toe(s): Secondary | ICD-10-CM | POA: Diagnosis not present

## 2016-07-21 DIAGNOSIS — M79675 Pain in left toe(s): Secondary | ICD-10-CM

## 2016-07-21 NOTE — Progress Notes (Signed)
Subjective: Kathy Harvey is a 65 y.o. female patient with history of diabetes who presents to office today complaining of long, painful nails; unable to trim. Patient  Is assisted by facility care giver. Patient states that the glucose reading this morning was not recorded; last A1c 5.6. Patient denies any new changes in medication or new problems. Patient denies any new cramping, numbness, burning or tingling in the legs.  Patient Active Problem List   Diagnosis Date Noted  . Vitamin D insufficiency 10/24/2013  . Chronic venous insufficiency 10/24/2013  . Ankle syndesmosis disruption 10/23/2013  . Dislocation of right ankle joint 10/23/2013  . Hypertension   . GERD (gastroesophageal reflux disease)   . Cerebral palsy (HCC)   . Osteoporosis   . 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 aspirin EC 81 MG tablet Take 81 mg by mouth daily.    . Calcium Carbonate-Vitamin D (CALCIUM 600+D HIGH POTENCY) 600-400 MG-UNIT per tablet Take 2 tablets by mouth daily.    . cyclobenzaprine (FLEXERIL) 10 MG tablet Take 10 mg by mouth 3 (three) times daily as needed for muscle spasms.    Marland Kitchen. docusate sodium 100 MG CAPS Take 100 mg by mouth 2 (two) times daily. 30 capsule 0  . enoxaparin (LOVENOX) 40 MG/0.4ML injection Inject 0.4 mLs (40 mg total) into the skin daily. 21 Syringe 0  . EPINEPHrine (EPIPEN) 0.3 mg/0.3 mL SOAJ injection Inject 0.3 mg into the muscle as needed (allergic reaction).    Marland Kitchen. loratadine (CLARITIN) 10 MG tablet Take 10 mg by mouth daily.    Marland Kitchen. losartan-hydrochlorothiazide (HYZAAR) 100-12.5 MG per tablet Take 1 tablet by mouth daily.    Marland Kitchen. lovastatin (MEVACOR) 40 MG tablet Take 40 mg by mouth at bedtime.    . Omega-3 Fatty Acids (FISH OIL) 500 MG CAPS Take 500 mg by mouth daily.    Marland Kitchen. omeprazole (PRILOSEC) 20 MG capsule Take 20 mg by mouth daily.    Marland Kitchen. oxyCODONE (OXY IR/ROXICODONE) 5 MG immediate  release tablet Take 1-2 tablets (5-10 mg total) by mouth every 3 (three) hours as needed for breakthrough pain (take between percocet). 60 tablet 0  . oxyCODONE-acetaminophen (PERCOCET/ROXICET) 5-325 MG per tablet Take 1-2 tablets by mouth every 6 (six) hours as needed for moderate pain or severe pain. 90 tablet 0  . polyethylene glycol (MIRALAX / GLYCOLAX) packet Take 17 g by mouth daily as needed (constipation).    . Vitamin D, Ergocalciferol, (DRISDOL) 50000 UNITS CAPS capsule Take 1 capsule (50,000 Units total) by mouth every 7 (seven) days. 8 capsule 1   No current facility-administered medications on file prior to visit.    Allergies  Allergen Reactions  . Bacitracin Other (See Comments)    Per MAR  . Bee Venom Other (See Comments)    Per MAR  . Erythromycin Base Other (See Comments)    Per MAR  . Neomycin Other (See Comments)    Per MAR  . Polymyxin B Other (See Comments)    Per MAR    No results found for this or any previous visit (from the past 2160 hour(s)).  Objective: General: Patient is awake, alert, and oriented x 3 and in no acute distress in wheelchair.   Integument: Skin is cool, dry, and supple bilateral. Nails are tender, long, thickened and dystrophic with subungual debris, consistent with onychomycosis, 1-5 bilateral. No signs of infection. No open  lesions or preulcerative lesions present bilateral. Remaining integument unremarkable.  Vasculature:  Dorsalis Pedis pulse 0/4 bilateral. Posterior Tibial pulse  0/4 bilateral. Capillary fill time <5 sec 1-5 bilateral. No hair growth to the level of the digits.Temperature gradient decreased bilateral. + varicosities present bilateral. 1+ pitting edema present bilateral and blue discoloration to feet and legs.  Neurology: The patient has intact sensation measured with a 5.07/10g Semmes Weinstein Monofilament at all pedal sites bilateral . Vibratory sensation diminished bilateral with tuning fork. No Babinski sign present  bilateral.   Musculoskeletal: No symptomatic pedal deformities noted bilateral. Muscular strength 4/5 in all lower extremity muscular groups bilateral without pain on range of motion. Limitation on right ankle due to previous ankle fracture history. No tenderness with calf compression bilateral.  Assessment and Plan: Problem List Items Addressed This Visit      Cardiovascular and Mediastinum   Chronic venous insufficiency (Chronic)    Other Visit Diagnoses    Dermatophytosis of nail    -  Primary   Toe pain, bilateral       Diabetic polyneuropathy associated with type 2 diabetes mellitus (HCC)         -Examined patient. -Discussed and educated patient on diabetic foot care, especially with  regards to the vascular, neurological and musculoskeletal systems.  -Stressed the importance of good glycemic control and the detriment of not  controlling glucose levels in relation to the foot. -Mechanically debrided all nails 1-5 bilateral using sterile nail nipper and filed with dremel without incident  -Recommend elevation when sitting to assist with edema control -Answered all patient questions -Patient to return  in 3 months for at risk foot care -Patient advised to call the office if any problems or questions arise in the meantime.  Asencion Islam, DPM

## 2016-10-25 ENCOUNTER — Ambulatory Visit: Payer: Medicare Other | Admitting: Sports Medicine

## 2016-11-01 ENCOUNTER — Ambulatory Visit: Payer: Medicare Other | Admitting: Sports Medicine

## 2016-11-17 ENCOUNTER — Encounter: Payer: Self-pay | Admitting: Sports Medicine

## 2016-11-17 ENCOUNTER — Ambulatory Visit (INDEPENDENT_AMBULATORY_CARE_PROVIDER_SITE_OTHER): Payer: Medicare Other | Admitting: Sports Medicine

## 2016-11-17 DIAGNOSIS — M79674 Pain in right toe(s): Secondary | ICD-10-CM | POA: Diagnosis not present

## 2016-11-17 DIAGNOSIS — I872 Venous insufficiency (chronic) (peripheral): Secondary | ICD-10-CM

## 2016-11-17 DIAGNOSIS — B351 Tinea unguium: Secondary | ICD-10-CM

## 2016-11-17 DIAGNOSIS — M79675 Pain in left toe(s): Secondary | ICD-10-CM | POA: Diagnosis not present

## 2016-11-17 DIAGNOSIS — E1142 Type 2 diabetes mellitus with diabetic polyneuropathy: Secondary | ICD-10-CM | POA: Diagnosis not present

## 2016-11-17 NOTE — Progress Notes (Signed)
Subjective: Kathy Harvey is a 66 y.o. female patient with history of diabetes who presents to office today complaining of long, painful nails; unable to trim. Patient  Is assisted by facility care giver. Patient states that the glucose reading this morning was good. Reports that in physical therapy. The therapist dropped a walker on her left foot, causing abrasions to toes, however. Patient denies pain on today. States that on yesterday when it happened. She had pain but no residual pain since. Patient states that by April. She will be moved and at assisted living. Patient denies any new changes in medication or new problems. Patient denies any new cramping, numbness, burning or tingling in the legs.  Patient Active Problem List   Diagnosis Date Noted  . Vitamin D insufficiency 10/24/2013  . Chronic venous insufficiency 10/24/2013  . Ankle syndesmosis disruption 10/23/2013  . Dislocation of right ankle joint 10/23/2013  . Hypertension   . GERD (gastroesophageal reflux disease)   . Cerebral palsy (HCC)   . Osteoporosis   . 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 aspirin EC 81 MG tablet Take 81 mg by mouth daily.    . Calcium Carbonate-Vitamin D (CALCIUM 600+D HIGH POTENCY) 600-400 MG-UNIT per tablet Take 2 tablets by mouth daily.    . cyclobenzaprine (FLEXERIL) 10 MG tablet Take 10 mg by mouth 3 (three) times daily as needed for muscle spasms.    Marland Kitchen. docusate sodium 100 MG CAPS Take 100 mg by mouth 2 (two) times daily. 30 capsule 0  . enoxaparin (LOVENOX) 40 MG/0.4ML injection Inject 0.4 mLs (40 mg total) into the skin daily. 21 Syringe 0  . EPINEPHrine (EPIPEN) 0.3 mg/0.3 mL SOAJ injection Inject 0.3 mg into the muscle as needed (allergic reaction).    Marland Kitchen. loratadine (CLARITIN) 10 MG tablet Take 10 mg by mouth daily.    Marland Kitchen. losartan-hydrochlorothiazide (HYZAAR) 100-12.5 MG per tablet Take 1 tablet by  mouth daily.    Marland Kitchen. lovastatin (MEVACOR) 40 MG tablet Take 40 mg by mouth at bedtime.    . Omega-3 Fatty Acids (FISH OIL) 500 MG CAPS Take 500 mg by mouth daily.    Marland Kitchen. omeprazole (PRILOSEC) 20 MG capsule Take 20 mg by mouth daily.    Marland Kitchen. oxyCODONE (OXY IR/ROXICODONE) 5 MG immediate release tablet Take 1-2 tablets (5-10 mg total) by mouth every 3 (three) hours as needed for breakthrough pain (take between percocet). 60 tablet 0  . oxyCODONE-acetaminophen (PERCOCET/ROXICET) 5-325 MG per tablet Take 1-2 tablets by mouth every 6 (six) hours as needed for moderate pain or severe pain. 90 tablet 0  . polyethylene glycol (MIRALAX / GLYCOLAX) packet Take 17 g by mouth daily as needed (constipation).    . Vitamin D, Ergocalciferol, (DRISDOL) 50000 UNITS CAPS capsule Take 1 capsule (50,000 Units total) by mouth every 7 (seven) days. 8 capsule 1   No current facility-administered medications on file prior to visit.    Allergies  Allergen Reactions  . Bacitracin Other (See Comments)    Per MAR  . Bee Venom Other (See Comments)    Per MAR  . Erythromycin Base Other (See Comments)    Per MAR  . Neomycin Other (See Comments)    Per MAR  . Polymyxin B Other (See Comments)    Per MAR    No results found for this or any previous visit (from the past 2160 hour(s)).  Objective: General: Patient is awake, alert, and oriented x 3 and in no acute distress in wheelchair.   Integument: Skin is cool, dry, and supple bilateral. Nails are tender, long, thickened and dystrophic with subungual debris, consistent with onychomycosis, 1-5 bilateral. Painless Abrasions to left first through third toes.No signs of infection. No open lesions or preulcerative lesions present bilateral. Remaining integument unremarkable.  Vasculature:  Dorsalis Pedis pulse 0/4 bilateral. Posterior Tibial pulse  0/4 bilateral. Capillary fill time <5 sec 1-5 bilateral. No hair growth to the level of the digits.Temperature gradient decreased  bilateral. + varicosities present bilateral. 1+ pitting edema present bilateral and blue discoloration to feet and legs.  Neurology: The patient has intact sensation measured with a 5.07/10g Semmes Weinstein Monofilament at all pedal sites bilateral . Vibratory sensation diminished bilateral with tuning fork. No Babinski sign present bilateral.   Musculoskeletal: No symptomatic pedal deformities noted bilateral. Muscular strength 4/5 in all lower extremity muscular groups bilateral without pain on range of motion. Limitation on right ankle due to previous ankle fracture history. No tenderness with calf compression bilateral.  Assessment and Plan: Problem List Items Addressed This Visit      Cardiovascular and Mediastinum   Chronic venous insufficiency (Chronic)    Other Visit Diagnoses    Dermatophytosis of nail    -  Primary   Toe pain, bilateral       Diabetic polyneuropathy associated with type 2 diabetes mellitus (HCC)         -Examined patient. -Discussed and educated patient on diabetic foot care, especially with  regards to the vascular, neurological and musculoskeletal systems.  -Stressed the importance of good glycemic control and the detriment of not controlling glucose levels in relation to the foot. -Mechanically debrided all nails 1-5 bilateral using sterile nail nipper and filed with dremel without incident  -Orders written to her facility for topical antibiotic cream to abrasions until healed -Recommend elevation when sitting to assist with edema control -Answered all patient questions -Patient to return  in 3 months for at risk foot care -Patient advised to call the office if any problems or questions arise in the meantime.  Asencion Islam, DPM

## 2017-02-16 ENCOUNTER — Encounter: Payer: Self-pay | Admitting: Sports Medicine

## 2017-02-16 ENCOUNTER — Ambulatory Visit (INDEPENDENT_AMBULATORY_CARE_PROVIDER_SITE_OTHER): Payer: Medicare Other | Admitting: Sports Medicine

## 2017-02-16 DIAGNOSIS — B351 Tinea unguium: Secondary | ICD-10-CM

## 2017-02-16 DIAGNOSIS — E1142 Type 2 diabetes mellitus with diabetic polyneuropathy: Secondary | ICD-10-CM

## 2017-02-16 DIAGNOSIS — I872 Venous insufficiency (chronic) (peripheral): Secondary | ICD-10-CM

## 2017-02-16 DIAGNOSIS — M79675 Pain in left toe(s): Secondary | ICD-10-CM

## 2017-02-16 DIAGNOSIS — M79674 Pain in right toe(s): Secondary | ICD-10-CM

## 2017-02-16 NOTE — Progress Notes (Signed)
Subjective: Kathy Harvey is a 66 y.o. female patient with history of diabetes who returns for diabetic foot care. Patient is assisted by Zenaida Niecevan driver from [facility] and reports that she may be leaving the facility soon to go home. Reports blood sugars are good. Patient denies any new changes in medication or new problems. Patient denies any new cramping, numbness, burning or tingling in the legs.  Patient Active Problem List   Diagnosis Date Noted  . Vitamin D insufficiency 10/24/2013  . Chronic venous insufficiency 10/24/2013  . Ankle syndesmosis disruption 10/23/2013  . Dislocation of right ankle joint 10/23/2013  . Hypertension   . GERD (gastroesophageal reflux disease)   . Cerebral palsy (HCC)   . Osteoporosis   . 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 aspirin EC 81 MG tablet Take 81 mg by mouth daily.    . Calcium Carbonate-Vitamin D (CALCIUM 600+D HIGH POTENCY) 600-400 MG-UNIT per tablet Take 2 tablets by mouth daily.    . cyclobenzaprine (FLEXERIL) 10 MG tablet Take 10 mg by mouth 3 (three) times daily as needed for muscle spasms.    Marland Kitchen. docusate sodium 100 MG CAPS Take 100 mg by mouth 2 (two) times daily. 30 capsule 0  . enoxaparin (LOVENOX) 40 MG/0.4ML injection Inject 0.4 mLs (40 mg total) into the skin daily. 21 Syringe 0  . EPINEPHrine (EPIPEN) 0.3 mg/0.3 mL SOAJ injection Inject 0.3 mg into the muscle as needed (allergic reaction).    Marland Kitchen. loratadine (CLARITIN) 10 MG tablet Take 10 mg by mouth daily.    Marland Kitchen. losartan-hydrochlorothiazide (HYZAAR) 100-12.5 MG per tablet Take 1 tablet by mouth daily.    Marland Kitchen. lovastatin (MEVACOR) 40 MG tablet Take 40 mg by mouth at bedtime.    . Omega-3 Fatty Acids (FISH OIL) 500 MG CAPS Take 500 mg by mouth daily.    Marland Kitchen. omeprazole (PRILOSEC) 20 MG capsule Take 20 mg by mouth daily.    Marland Kitchen. oxyCODONE (OXY IR/ROXICODONE) 5 MG immediate release tablet Take 1-2  tablets (5-10 mg total) by mouth every 3 (three) hours as needed for breakthrough pain (take between percocet). 60 tablet 0  . oxyCODONE-acetaminophen (PERCOCET/ROXICET) 5-325 MG per tablet Take 1-2 tablets by mouth every 6 (six) hours as needed for moderate pain or severe pain. 90 tablet 0  . polyethylene glycol (MIRALAX / GLYCOLAX) packet Take 17 g by mouth daily as needed (constipation).    . Vitamin D, Ergocalciferol, (DRISDOL) 50000 UNITS CAPS capsule Take 1 capsule (50,000 Units total) by mouth every 7 (seven) days. 8 capsule 1   No current facility-administered medications on file prior to visit.    Allergies  Allergen Reactions  . Bacitracin Other (See Comments)    Per MAR  . Bee Venom Other (See Comments)    Per MAR  . Erythromycin Base Other (See Comments)    Per MAR  . Neomycin Other (See Comments)    Per MAR  . Polymyxin B Other (See Comments)    Per MAR    No results found for this or any previous visit (from the past 2160 hour(s)).  Objective: General: Patient is awake, alert, and oriented x 3 and in no acute distress in wheelchair.   Integument: Skin is cool, dry, and supple bilateral. Nails are tender, long, thickened and dystrophic with subungual debris, consistent with onychomycosis, 1-5 bilateral.No signs of infection. No open lesions or preulcerative  lesions present bilateral. Remaining integument unremarkable.  Vasculature:  Dorsalis Pedis pulse 0/4 bilateral. Posterior Tibial pulse  0/4 bilateral. Capillary fill time <5 sec 1-5 bilateral. No hair growth to the level of the digits.Temperature gradient decreased bilateral. + varicosities present bilateral. 1+ pitting edema present bilateral and blue discoloration to feet and legs.  Neurology: The patient has intact sensation measured with a 5.07/10g Semmes Weinstein Monofilament at all pedal sites bilateral . Vibratory sensation diminished bilateral with tuning fork. No Babinski sign present bilateral.    Musculoskeletal: Asymptomatic hammertoe pedal deformities noted bilateral. Muscular strength 4/5 in all lower extremity muscular groups bilateral without pain on range of motion. Limitation on right ankle due to previous ankle fracture history. No tenderness with calf compression bilateral.  Assessment and Plan: Problem List Items Addressed This Visit      Cardiovascular and Mediastinum   Chronic venous insufficiency (Chronic)    Other Visit Diagnoses    Dermatophytosis of nail    -  Primary   Toe pain, bilateral       Diabetic polyneuropathy associated with type 2 diabetes mellitus (HCC)         -Examined patient. -Discussed and educated patient on diabetic foot care, especially with  regards to the vascular, neurological and musculoskeletal systems.  -Stressed the importance of good glycemic control and the detriment of not controlling glucose levels in relation to the foot. -Mechanically debrided all nails 1-5 bilateral using sterile nail nipper and filed with dremel without incident  -Recommend elevation when sitting to assist with edema control -Answered all patient questions -Patient to return  in 3 months for at risk foot care -Patient advised to call the office if any problems or questions arise in the meantime.  Asencion Islam, DPM

## 2017-05-18 ENCOUNTER — Ambulatory Visit (INDEPENDENT_AMBULATORY_CARE_PROVIDER_SITE_OTHER): Payer: Medicare Other | Admitting: Sports Medicine

## 2017-05-18 DIAGNOSIS — B351 Tinea unguium: Secondary | ICD-10-CM | POA: Diagnosis not present

## 2017-05-18 DIAGNOSIS — I872 Venous insufficiency (chronic) (peripheral): Secondary | ICD-10-CM

## 2017-05-18 DIAGNOSIS — M79674 Pain in right toe(s): Secondary | ICD-10-CM | POA: Diagnosis not present

## 2017-05-18 DIAGNOSIS — E1142 Type 2 diabetes mellitus with diabetic polyneuropathy: Secondary | ICD-10-CM

## 2017-05-18 DIAGNOSIS — M79675 Pain in left toe(s): Secondary | ICD-10-CM

## 2017-05-18 NOTE — Progress Notes (Signed)
Subjective: Kathy Harvey is a 66 y.o. female patient with history of diabetes who returns for diabetic foot care. Patient is from [facility] and reports that she will be permanently staying [facility] at the facility because unable to care for herself at home. Reports blood sugars are good. Patient denies any new changes in medication or new problems except for recently having pneumonia.  Patient Active Problem List   Diagnosis Date Noted  . Vitamin D insufficiency 10/24/2013  . Chronic venous insufficiency 10/24/2013  . Ankle syndesmosis disruption 10/23/2013  . Dislocation of right ankle joint 10/23/2013  . Hypertension   . GERD (gastroesophageal reflux disease)   . Cerebral palsy (HCC)   . Osteoporosis   . 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 aspirin EC 81 MG tablet Take 81 mg by mouth daily.    . Calcium Carbonate-Vitamin D (CALCIUM 600+D HIGH POTENCY) 600-400 MG-UNIT per tablet Take 2 tablets by mouth daily.    . cyclobenzaprine (FLEXERIL) 10 MG tablet Take 10 mg by mouth 3 (three) times daily as needed for muscle spasms.    Marland Kitchen docusate sodium 100 MG CAPS Take 100 mg by mouth 2 (two) times daily. 30 capsule 0  . enoxaparin (LOVENOX) 40 MG/0.4ML injection Inject 0.4 mLs (40 mg total) into the skin daily. 21 Syringe 0  . EPINEPHrine (EPIPEN) 0.3 mg/0.3 mL SOAJ injection Inject 0.3 mg into the muscle as needed (allergic reaction).    Marland Kitchen loratadine (CLARITIN) 10 MG tablet Take 10 mg by mouth daily.    Marland Kitchen losartan-hydrochlorothiazide (HYZAAR) 100-12.5 MG per tablet Take 1 tablet by mouth daily.    Marland Kitchen lovastatin (MEVACOR) 40 MG tablet Take 40 mg by mouth at bedtime.    . Omega-3 Fatty Acids (FISH OIL) 500 MG CAPS Take 500 mg by mouth daily.    Marland Kitchen omeprazole (PRILOSEC) 20 MG capsule Take 20 mg by mouth daily.    Marland Kitchen oxyCODONE (OXY IR/ROXICODONE) 5 MG immediate release tablet Take 1-2 tablets (5-10  mg total) by mouth every 3 (three) hours as needed for breakthrough pain (take between percocet). 60 tablet 0  . oxyCODONE-acetaminophen (PERCOCET/ROXICET) 5-325 MG per tablet Take 1-2 tablets by mouth every 6 (six) hours as needed for moderate pain or severe pain. 90 tablet 0  . polyethylene glycol (MIRALAX / GLYCOLAX) packet Take 17 g by mouth daily as needed (constipation).    . Vitamin D, Ergocalciferol, (DRISDOL) 50000 UNITS CAPS capsule Take 1 capsule (50,000 Units total) by mouth every 7 (seven) days. 8 capsule 1   No current facility-administered medications on file prior to visit.    Allergies  Allergen Reactions  . Bacitracin Other (See Comments)    Per MAR  . Bee Venom Other (See Comments)    Per MAR  . Erythromycin Base Other (See Comments)    Per MAR  . Neomycin Other (See Comments)    Per MAR  . Polymyxin B Other (See Comments)    Per MAR    No results found for this or any previous visit (from the past 2160 hour(s)).  Objective: General: Patient is awake, alert, and oriented x 3 and in no acute distress in wheelchair.   Integument: Skin is cool, dry, and supple bilateral. Nails are tender, long, thickened and dystrophic with subungual debris, consistent with onychomycosis, 1-5 bilateral.Abrasion to right fifth toe. No signs of infection. No open lesions or  preulcerative lesions present bilateral. Remaining integument unremarkable.  Vasculature:  Dorsalis Pedis pulse 0/4 bilateral. Posterior Tibial pulse  0/4 bilateral. Capillary fill time <5 sec 1-5 bilateral. No hair growth to the level of the digits.Temperature gradient decreased bilateral. + varicosities present bilateral. 1+ pitting edema present bilateral and blue discoloration to feet and legs.  Neurology: The patient has intact sensation measured with a 5.07/10g Semmes Weinstein Monofilament at all pedal sites bilateral. Vibratory sensation diminished bilateral with tuning fork. No Babinski sign present bilateral.    Musculoskeletal: Asymptomatic hammertoe pedal deformities noted bilateral. Muscular strength 4/5 in all lower extremity muscular groups bilateral without pain on range of motion. Limitation on right ankle due to previous ankle fracture history. No tenderness with calf compression bilateral.  Assessment and Plan: Problem List Items Addressed This Visit      Cardiovascular and Mediastinum   Chronic venous insufficiency (Chronic)    Other Visit Diagnoses    Dermatophytosis of nail    -  Primary   Toe pain, bilateral       Diabetic polyneuropathy associated with type 2 diabetes mellitus (HCC)         -Examined patient. -Discussed and educated patient on diabetic foot care, especially with  regards to the vascular, neurological and musculoskeletal systems.  -Stressed the importance of good glycemic control and the detriment of not controlling glucose levels in relation to the foot. -Mechanically debrided all nails 1-5 bilateral using sterile nail nipper and filed with dremel without incident  -Recommend close monitoring of fifth toe abrasion -Recommend continue with elevation when sitting to assist with edema control -Answered all patient questions -Patient to return  in 3 months for at risk foot care -Patient advised to call the office if any problems or questions arise in the meantime.  Asencion Islam, DPM

## 2017-08-22 ENCOUNTER — Ambulatory Visit (INDEPENDENT_AMBULATORY_CARE_PROVIDER_SITE_OTHER): Payer: Medicare Other | Admitting: Sports Medicine

## 2017-08-22 ENCOUNTER — Encounter: Payer: Self-pay | Admitting: Sports Medicine

## 2017-08-22 DIAGNOSIS — M79674 Pain in right toe(s): Secondary | ICD-10-CM

## 2017-08-22 DIAGNOSIS — M79675 Pain in left toe(s): Secondary | ICD-10-CM

## 2017-08-22 DIAGNOSIS — B351 Tinea unguium: Secondary | ICD-10-CM | POA: Diagnosis not present

## 2017-08-22 DIAGNOSIS — I872 Venous insufficiency (chronic) (peripheral): Secondary | ICD-10-CM

## 2017-08-22 DIAGNOSIS — E1142 Type 2 diabetes mellitus with diabetic polyneuropathy: Secondary | ICD-10-CM | POA: Diagnosis not present

## 2017-08-22 NOTE — Progress Notes (Signed)
Subjective: Kathy Harvey is a 66 y.o. female patient with history of diabetes who returns for diabetic foot care. Patient is from [facility] and reports that she has lost over 25 lbs in the last month and doctor has her on protein shakes. Reports blood sugars are elevated and her Dr is watching it. Patient denies any other issues.   Patient Active Problem List   Diagnosis Date Noted  . Vitamin D insufficiency 10/24/2013  . Chronic venous insufficiency 10/24/2013  . Ankle syndesmosis disruption 10/23/2013  . Dislocation of right ankle joint 10/23/2013  . Hypertension   . GERD (gastroesophageal reflux disease)   . Cerebral palsy (HCC)   . Osteoporosis   . 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 aspirin EC 81 MG tablet Take 81 mg by mouth daily.    . Calcium Carbonate-Vitamin D (CALCIUM 600+D HIGH POTENCY) 600-400 MG-UNIT per tablet Take 2 tablets by mouth daily.    . cyclobenzaprine (FLEXERIL) 10 MG tablet Take 10 mg by mouth 3 (three) times daily as needed for muscle spasms.    Marland Kitchen. docusate sodium 100 MG CAPS Take 100 mg by mouth 2 (two) times daily. 30 capsule 0  . enoxaparin (LOVENOX) 40 MG/0.4ML injection Inject 0.4 mLs (40 mg total) into the skin daily. 21 Syringe 0  . EPINEPHrine (EPIPEN) 0.3 mg/0.3 mL SOAJ injection Inject 0.3 mg into the muscle as needed (allergic reaction).    Marland Kitchen. loratadine (CLARITIN) 10 MG tablet Take 10 mg by mouth daily.    Marland Kitchen. losartan-hydrochlorothiazide (HYZAAR) 100-12.5 MG per tablet Take 1 tablet by mouth daily.    Marland Kitchen. lovastatin (MEVACOR) 40 MG tablet Take 40 mg by mouth at bedtime.    . Omega-3 Fatty Acids (FISH OIL) 500 MG CAPS Take 500 mg by mouth daily.    Marland Kitchen. omeprazole (PRILOSEC) 20 MG capsule Take 20 mg by mouth daily.    Marland Kitchen. oxyCODONE (OXY IR/ROXICODONE) 5 MG immediate release tablet Take 1-2 tablets (5-10 mg total) by mouth every 3 (three) hours as needed for  breakthrough pain (take between percocet). 60 tablet 0  . oxyCODONE-acetaminophen (PERCOCET/ROXICET) 5-325 MG per tablet Take 1-2 tablets by mouth every 6 (six) hours as needed for moderate pain or severe pain. 90 tablet 0  . polyethylene glycol (MIRALAX / GLYCOLAX) packet Take 17 g by mouth daily as needed (constipation).    . Vitamin D, Ergocalciferol, (DRISDOL) 50000 UNITS CAPS capsule Take 1 capsule (50,000 Units total) by mouth every 7 (seven) days. 8 capsule 1   No current facility-administered medications on file prior to visit.    Allergies  Allergen Reactions  . Bacitracin Other (See Comments)    Per MAR  . Bee Venom Other (See Comments)    Per MAR  . Erythromycin Base Other (See Comments)    Per MAR  . Neomycin Other (See Comments)    Per MAR  . Polymyxin B Other (See Comments)    Per MAR    No results found for this or any previous visit (from the past 2160 hour(s)).  Objective: General: Patient is awake, alert, and oriented x 3 and in no acute distress in wheelchair.   Integument: Skin is cool, dry, and supple bilateral. Nails are tender, long, thickened and dystrophic with subungual debris, consistent with onychomycosis, 1-5 bilateral.Abrasion to right fifth toe. No signs of infection. No open lesions or preulcerative lesions present  bilateral. Remaining integument unremarkable.  Vasculature:  Dorsalis Pedis pulse 0/4 bilateral. Posterior Tibial pulse  0/4 bilateral. Capillary fill time <5 sec 1-5 bilateral. No hair growth to the level of the digits.Temperature gradient decreased bilateral. + varicosities present bilateral. 1+ pitting edema present bilateral and blue discoloration to feet and legs.  Neurology: The patient has intact sensation measured with a 5.07/10g Semmes Weinstein Monofilament at all pedal sites bilateral. Vibratory sensation diminished bilateral with tuning fork. No Babinski sign present bilateral.   Musculoskeletal: Asymptomatic hammertoe pedal  deformities noted bilateral. Muscular strength 4/5 in all lower extremity muscular groups bilateral without pain on range of motion. Limitation on right ankle due to previous ankle fracture history. No tenderness with calf compression bilateral.  Assessment and Plan: Problem List Items Addressed This Visit      Cardiovascular and Mediastinum   Chronic venous insufficiency (Chronic)    Other Visit Diagnoses    Dermatophytosis of nail    -  Primary   Toe pain, bilateral       Diabetic polyneuropathy associated with type 2 diabetes mellitus (HCC)         -Examined patient. -Discussed and educated patient on diabetic foot care, especially with  regards to the vascular, neurological and musculoskeletal systems.  -Stressed the importance of good glycemic control and the detriment of not controlling glucose levels in relation to the foot. -Mechanically debrided all nails 1-5 bilateral using sterile nail nipper and filed with dremel without incident  -Recommend continue with elevation when sitting to assist with edema control -Answered all patient questions -Patient to return  in 3 months for at risk foot care -Patient advised to call the office if any problems or questions arise in the meantime.  Asencion Islamitorya Brentlee Sciara, DPM

## 2017-11-22 ENCOUNTER — Ambulatory Visit: Payer: Medicare Other | Admitting: Sports Medicine

## 2017-11-28 ENCOUNTER — Ambulatory Visit (INDEPENDENT_AMBULATORY_CARE_PROVIDER_SITE_OTHER): Payer: Medicare Other | Admitting: Sports Medicine

## 2017-11-28 ENCOUNTER — Encounter: Payer: Self-pay | Admitting: Sports Medicine

## 2017-11-28 DIAGNOSIS — M79674 Pain in right toe(s): Secondary | ICD-10-CM | POA: Diagnosis not present

## 2017-11-28 DIAGNOSIS — M79675 Pain in left toe(s): Secondary | ICD-10-CM | POA: Diagnosis not present

## 2017-11-28 DIAGNOSIS — E1142 Type 2 diabetes mellitus with diabetic polyneuropathy: Secondary | ICD-10-CM | POA: Diagnosis not present

## 2017-11-28 DIAGNOSIS — B351 Tinea unguium: Secondary | ICD-10-CM | POA: Diagnosis not present

## 2017-11-28 DIAGNOSIS — I872 Venous insufficiency (chronic) (peripheral): Secondary | ICD-10-CM

## 2017-11-28 NOTE — Progress Notes (Signed)
Subjective: Kathy Harvey is a 67 y.o. female patient with history of diabetes who returns for diabetic foot care. Patient is from [facility] and states that she did not check her morning blood glucose and reports that her last office visit was at the facility where the facility doctor visited her.  Patient denies any other issues.   Patient Active Problem List   Diagnosis Date Noted  . Vitamin D insufficiency 10/24/2013  . Chronic venous insufficiency 10/24/2013  . Ankle syndesmosis disruption 10/23/2013  . Dislocation of right ankle joint 10/23/2013  . Hypertension   . GERD (gastroesophageal reflux disease)   . Cerebral palsy (HCC)   . Osteoporosis   . 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 aspirin EC 81 MG tablet Take 81 mg by mouth daily.    . Calcium Carbonate-Vitamin D (CALCIUM 600+D HIGH POTENCY) 600-400 MG-UNIT per tablet Take 2 tablets by mouth daily.    . cyclobenzaprine (FLEXERIL) 10 MG tablet Take 10 mg by mouth 3 (three) times daily as needed for muscle spasms.    Marland Kitchen docusate sodium 100 MG CAPS Take 100 mg by mouth 2 (two) times daily. 30 capsule 0  . enoxaparin (LOVENOX) 40 MG/0.4ML injection Inject 0.4 mLs (40 mg total) into the skin daily. 21 Syringe 0  . EPINEPHrine (EPIPEN) 0.3 mg/0.3 mL SOAJ injection Inject 0.3 mg into the muscle as needed (allergic reaction).    Marland Kitchen loratadine (CLARITIN) 10 MG tablet Take 10 mg by mouth daily.    Marland Kitchen losartan-hydrochlorothiazide (HYZAAR) 100-12.5 MG per tablet Take 1 tablet by mouth daily.    Marland Kitchen lovastatin (MEVACOR) 40 MG tablet Take 40 mg by mouth at bedtime.    . Omega-3 Fatty Acids (FISH OIL) 500 MG CAPS Take 500 mg by mouth daily.    Marland Kitchen omeprazole (PRILOSEC) 20 MG capsule Take 20 mg by mouth daily.    Marland Kitchen oxyCODONE (OXY IR/ROXICODONE) 5 MG immediate release tablet Take 1-2 tablets (5-10 mg total) by mouth every 3 (three) hours as needed for  breakthrough pain (take between percocet). 60 tablet 0  . oxyCODONE-acetaminophen (PERCOCET/ROXICET) 5-325 MG per tablet Take 1-2 tablets by mouth every 6 (six) hours as needed for moderate pain or severe pain. 90 tablet 0  . polyethylene glycol (MIRALAX / GLYCOLAX) packet Take 17 g by mouth daily as needed (constipation).    . Vitamin D, Ergocalciferol, (DRISDOL) 50000 UNITS CAPS capsule Take 1 capsule (50,000 Units total) by mouth every 7 (seven) days. 8 capsule 1   No current facility-administered medications on file prior to visit.    Allergies  Allergen Reactions  . Bacitracin Other (See Comments)    Per MAR  . Bee Venom Other (See Comments)    Per MAR  . Erythromycin Base Other (See Comments)    Per MAR  . Neomycin Other (See Comments)    Per MAR  . Polymyxin B Other (See Comments)    Per MAR    No results found for this or any previous visit (from the past 2160 hour(s)).  Objective: General: Patient is awake, alert, and oriented x 3 and in no acute distress in wheelchair.   Integument: Skin is cool, dry, and supple bilateral. Nails are tender, long, thickened and dystrophic with subungual debris, consistent with onychomycosis, 1-5 bilateral.Abrasion to right fifth toe. No signs of infection. No open lesions or preulcerative lesions present bilateral. Remaining  integument unremarkable.  Vasculature:  Dorsalis Pedis pulse 0/4 bilateral. Posterior Tibial pulse  0/4 bilateral. Capillary fill time <5 sec 1-5 bilateral. No hair growth to the level of the digits.Temperature gradient decreased bilateral. + varicosities present bilateral. 1+ pitting edema present bilateral and blue discoloration to feet and legs.  Neurology: The patient has intact sensation measured with a 5.07/10g Semmes Weinstein Monofilament at all pedal sites bilateral. Vibratory sensation diminished bilateral with tuning fork. No Babinski sign present bilateral.   Musculoskeletal: Asymptomatic hammertoe pedal  deformities noted bilateral. Muscular strength 4/5 in all lower extremity muscular groups bilateral without pain on range of motion. Limitation on right ankle due to previous ankle fracture history. No tenderness with calf compression bilateral.  Assessment and Plan: Problem List Items Addressed This Visit      Cardiovascular and Mediastinum   Chronic venous insufficiency (Chronic)    Other Visit Diagnoses    Dermatophytosis of nail    -  Primary   Toe pain, bilateral       Diabetic polyneuropathy associated with type 2 diabetes mellitus (HCC)         -Examined patient. -Discussed and educated patient on diabetic foot care, especially with  regards to the vascular, neurological and musculoskeletal systems.  -Stressed the importance of good glycemic control and the detriment of not controlling glucose levels in relation to the foot. -Mechanically debrided all nails 1-5 bilateral using sterile nail nipper and filed with dremel without incident  -Recommend continue with elevation when sitting to assist with edema control -Continue with wheelchair assisted ambulation -Answered all patient questions -Patient to return  in 3 months for at risk foot care -Patient advised to call the office if any problems or questions arise in the meantime.  Kathy Harvey, DPM

## 2018-02-27 ENCOUNTER — Ambulatory Visit: Payer: Medicare Other | Admitting: Sports Medicine

## 2018-04-19 ENCOUNTER — Ambulatory Visit (INDEPENDENT_AMBULATORY_CARE_PROVIDER_SITE_OTHER): Payer: Medicare Other | Admitting: Sports Medicine

## 2018-04-19 ENCOUNTER — Encounter: Payer: Self-pay | Admitting: Sports Medicine

## 2018-04-19 VITALS — BP 124/81 | HR 74 | Temp 98.9°F | Resp 16

## 2018-04-19 DIAGNOSIS — M79675 Pain in left toe(s): Secondary | ICD-10-CM

## 2018-04-19 DIAGNOSIS — E1142 Type 2 diabetes mellitus with diabetic polyneuropathy: Secondary | ICD-10-CM

## 2018-04-19 DIAGNOSIS — I872 Venous insufficiency (chronic) (peripheral): Secondary | ICD-10-CM

## 2018-04-19 DIAGNOSIS — B351 Tinea unguium: Secondary | ICD-10-CM

## 2018-04-19 DIAGNOSIS — M79674 Pain in right toe(s): Secondary | ICD-10-CM | POA: Diagnosis not present

## 2018-04-19 NOTE — Progress Notes (Signed)
Subjective: Kathy Harvey is a 67 y.o. female patient with history of diabetes who returns for diabetic foot care. Patient is from [facility] and states that the name has changed to [facility], she did not check her morning blood glucose and reports that her last office visit was at the facility where the facility doctor visits her, last A1c 5.8.  Patient denies any other issues.   Patient Active Problem List   Diagnosis Date Noted  . Vitamin D insufficiency 10/24/2013  . Chronic venous insufficiency 10/24/2013  . Ankle syndesmosis disruption 10/23/2013  . Dislocation of right ankle joint 10/23/2013  . Hypertension   . GERD (gastroesophageal reflux disease)   . Cerebral palsy (HCC)   . Osteoporosis   . 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 aspirin EC 81 MG tablet Take 81 mg by mouth daily.    . Calcium Carbonate-Vitamin D (CALCIUM 600+D HIGH POTENCY) 600-400 MG-UNIT per tablet Take 2 tablets by mouth daily.    . cyclobenzaprine (FLEXERIL) 10 MG tablet Take 10 mg by mouth 3 (three) times daily as needed for muscle spasms.    Marland Kitchen. docusate sodium 100 MG CAPS Take 100 mg by mouth 2 (two) times daily. 30 capsule 0  . enoxaparin (LOVENOX) 40 MG/0.4ML injection Inject 0.4 mLs (40 mg total) into the skin daily. 21 Syringe 0  . EPINEPHrine (EPIPEN) 0.3 mg/0.3 mL SOAJ injection Inject 0.3 mg into the muscle as needed (allergic reaction).    Marland Kitchen. loratadine (CLARITIN) 10 MG tablet Take 10 mg by mouth daily.    Marland Kitchen. losartan-hydrochlorothiazide (HYZAAR) 100-12.5 MG per tablet Take 1 tablet by mouth daily.    Marland Kitchen. lovastatin (MEVACOR) 40 MG tablet Take 40 mg by mouth at bedtime.    . Omega-3 Fatty Acids (FISH OIL) 500 MG CAPS Take 500 mg by mouth daily.    Marland Kitchen. omeprazole (PRILOSEC) 20 MG capsule Take 20 mg by mouth daily.    Marland Kitchen. oxyCODONE (OXY IR/ROXICODONE) 5 MG immediate release tablet Take 1-2 tablets (5-10 mg total)  by mouth every 3 (three) hours as needed for breakthrough pain (take between percocet). 60 tablet 0  . oxyCODONE-acetaminophen (PERCOCET/ROXICET) 5-325 MG per tablet Take 1-2 tablets by mouth every 6 (six) hours as needed for moderate pain or severe pain. 90 tablet 0  . polyethylene glycol (MIRALAX / GLYCOLAX) packet Take 17 g by mouth daily as needed (constipation).    . Vitamin D, Ergocalciferol, (DRISDOL) 50000 UNITS CAPS capsule Take 1 capsule (50,000 Units total) by mouth every 7 (seven) days. 8 capsule 1   No current facility-administered medications on file prior to visit.    Allergies  Allergen Reactions  . Bacitracin Other (See Comments)    Per MAR  . Bee Venom Other (See Comments)    Per MAR  . Erythromycin Base Other (See Comments)    Per MAR  . Neomycin Other (See Comments)    Per MAR  . Polymyxin B Other (See Comments)    Per MAR    No results found for this or any previous visit (from the past 2160 hour(s)).  Objective: General: Patient is awake, alert, and oriented x 3 and in no acute distress in wheelchair.   Integument: Skin is cool, dry, and supple bilateral. Nails are tender, long, thickened and dystrophic with subungual debris, consistent with onychomycosis, 1-5 bilateral.Abrasion to right fifth toe. No signs of infection.  No open lesions or preulcerative lesions present bilateral. Remaining integument unremarkable.  Vasculature:  Dorsalis Pedis pulse 0/4 bilateral. Posterior Tibial pulse  0/4 bilateral. Capillary fill time <5 sec 1-5 bilateral. No hair growth to the level of the digits.Temperature gradient decreased bilateral. + varicosities present bilateral. Trace edema present bilateral and blue discoloration to feet and legs.  Neurology: The patient has intact sensation measured with a 5.07/10g Semmes Weinstein Monofilament at all pedal sites bilateral. Vibratory sensation diminished bilateral with tuning fork. No Babinski sign present bilateral.    Musculoskeletal: Asymptomatic hammertoe pedal deformities noted bilateral. Muscular strength 4/5 in all lower extremity muscular groups bilateral without pain on range of motion. Limitation on right ankle due to previous ankle fracture history. No tenderness with calf compression bilateral.  Assessment and Plan: Problem List Items Addressed This Visit      Cardiovascular and Mediastinum   Chronic venous insufficiency (Chronic)    Other Visit Diagnoses    Dermatophytosis of nail    -  Primary   Toe pain, bilateral       Diabetic polyneuropathy associated with type 2 diabetes mellitus (HCC)         -Examined patient. -Discussed and educated patient on diabetic foot care, especially with  regards to the vascular, neurological and musculoskeletal systems.  -Stressed the importance of good glycemic control and the detriment of not controlling glucose levels in relation to the foot. -Mechanically debrided all nails 1-5 bilateral using sterile nail nipper and filed with dremel without incident  -Recommend continue with elevation when sitting to assist with edema control -Continue with wheelchair assisted ambulation -Patient to return  in 3 months for at risk foot care -Patient advised to call the office if any problems or questions arise in the meantime.  Asencion Islam, DPM

## 2018-07-08 ENCOUNTER — Encounter: Payer: Self-pay | Admitting: *Deleted

## 2018-07-19 ENCOUNTER — Ambulatory Visit: Payer: Medicare Other | Admitting: Sports Medicine

## 2018-08-02 ENCOUNTER — Ambulatory Visit (INDEPENDENT_AMBULATORY_CARE_PROVIDER_SITE_OTHER): Payer: Medicaid Other | Admitting: Sports Medicine

## 2018-08-02 ENCOUNTER — Encounter: Payer: Self-pay | Admitting: Sports Medicine

## 2018-08-02 DIAGNOSIS — M79675 Pain in left toe(s): Secondary | ICD-10-CM

## 2018-08-02 DIAGNOSIS — M79674 Pain in right toe(s): Secondary | ICD-10-CM

## 2018-08-02 DIAGNOSIS — B351 Tinea unguium: Secondary | ICD-10-CM | POA: Diagnosis not present

## 2018-08-02 DIAGNOSIS — E1142 Type 2 diabetes mellitus with diabetic polyneuropathy: Secondary | ICD-10-CM

## 2018-08-02 DIAGNOSIS — I872 Venous insufficiency (chronic) (peripheral): Secondary | ICD-10-CM

## 2018-08-02 NOTE — Progress Notes (Signed)
Subjective: Kathy Harvey is a 67 y.o. female patient with history of diabetes who returns for diabetic foot care. Patient is from [facility] which is now [facility] as patient reminded me again this visit, she did not check her morning blood glucose and reports that her last office visit was at the facility where the facility doctor visits her they checked it and it was 90 about 4 months ago, last A1c 5.8.  Patient denies any other issues.   Patient Active Problem List   Diagnosis Date Noted  . Vitamin D insufficiency 10/24/2013  . Chronic venous insufficiency 10/24/2013  . Ankle syndesmosis disruption 10/23/2013  . Dislocation of right ankle joint 10/23/2013  . Hypertension   . GERD (gastroesophageal reflux disease)   . Cerebral palsy (HCC)   . Osteoporosis   . 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 aspirin EC 81 MG tablet Take 81 mg by mouth daily.    . Calcium Carbonate-Vitamin D (CALCIUM 600+D HIGH POTENCY) 600-400 MG-UNIT per tablet Take 2 tablets by mouth daily.    . cyclobenzaprine (FLEXERIL) 10 MG tablet Take 10 mg by mouth 3 (three) times daily as needed for muscle spasms.    Marland Kitchen docusate sodium 100 MG CAPS Take 100 mg by mouth 2 (two) times daily. 30 capsule 0  . EPINEPHrine (EPIPEN) 0.3 mg/0.3 mL SOAJ injection Inject 0.3 mg into the muscle as needed (allergic reaction).    Marland Kitchen loratadine (CLARITIN) 10 MG tablet Take 10 mg by mouth daily.    Marland Kitchen losartan-hydrochlorothiazide (HYZAAR) 100-12.5 MG per tablet Take 1 tablet by mouth daily.    Marland Kitchen lovastatin (MEVACOR) 40 MG tablet Take 40 mg by mouth at bedtime.    . Omega-3 Fatty Acids (FISH OIL) 500 MG CAPS Take 500 mg by mouth daily.    Marland Kitchen omeprazole (PRILOSEC) 20 MG capsule Take 20 mg by mouth daily.    Marland Kitchen oxyCODONE (OXY IR/ROXICODONE) 5 MG immediate release tablet Take 1-2 tablets (5-10 mg total) by mouth every 3 (three) hours as needed for  breakthrough pain (take between percocet). 60 tablet 0  . oxyCODONE-acetaminophen (PERCOCET/ROXICET) 5-325 MG per tablet Take 1-2 tablets by mouth every 6 (six) hours as needed for moderate pain or severe pain. 90 tablet 0  . polyethylene glycol (MIRALAX / GLYCOLAX) packet Take 17 g by mouth daily as needed (constipation).    . Vitamin D, Ergocalciferol, (DRISDOL) 50000 UNITS CAPS capsule Take 1 capsule (50,000 Units total) by mouth every 7 (seven) days. 8 capsule 1  . enoxaparin (LOVENOX) 40 MG/0.4ML injection Inject 0.4 mLs (40 mg total) into the skin daily. 21 Syringe 0   No current facility-administered medications on file prior to visit.    Allergies  Allergen Reactions  . Bacitracin Other (See Comments)    Per MAR  . Bee Venom Other (See Comments)    Per MAR  . Erythromycin Base Other (See Comments)    Per MAR  . Neomycin Other (See Comments)    Per MAR  . Polymyxin B Other (See Comments)    Per MAR    No results found for this or any previous visit (from the past 2160 hour(s)).  Objective: General: Patient is awake, alert, and oriented x 3 and in no acute distress in wheelchair.   Integument: Skin is cool, dry, and supple bilateral. Nails are tender, long, thickened and dystrophic with subungual debris,  consistent with onychomycosis, 1-5 bilateral.Abrasion to right fifth toe. No signs of infection. No open lesions or preulcerative lesions present bilateral. Remaining integument unremarkable.  Vasculature:  Dorsalis Pedis pulse 0/4 bilateral. Posterior Tibial pulse  0/4 bilateral. Capillary fill time <5 sec 1-5 bilateral. No hair growth to the level of the digits.Temperature gradient decreased bilateral. + varicosities present bilateral. Trace edema present bilateral and blue discoloration to feet and legs.  Neurology: The patient has intact sensation measured with a 5.07/10g Semmes Weinstein Monofilament at all pedal sites bilateral. Vibratory sensation diminished bilateral with  tuning fork. No Babinski sign present bilateral.   Musculoskeletal: Asymptomatic hammertoe pedal deformities noted bilateral. Muscular strength 4/5 in all lower extremity muscular groups bilateral without pain on range of motion. Limitation on right ankle due to previous ankle fracture history. No tenderness with calf compression bilateral.  Assessment and Plan: Problem List Items Addressed This Visit      Cardiovascular and Mediastinum   Chronic venous insufficiency (Chronic)    Other Visit Diagnoses    Dermatophytosis of nail    -  Primary   Toe pain, bilateral       Diabetic polyneuropathy associated with type 2 diabetes mellitus (HCC)         -Examined patient. -Discussed and educated patient on diabetic foot care, especially with  regards to the vascular, neurological and musculoskeletal systems.  -Mechanically debrided all nails 1-5 bilateral using sterile nail nipper and filed with dremel without incident  -Recommend continue with elevation when sitting to assist with edema control as previous -Continue with wheelchair assisted ambulation -Patient to return  in 3 months for at risk foot care -Patient advised to call the office if any problems or questions arise in the meantime.  Asencion Islam, DPM

## 2018-10-31 ENCOUNTER — Ambulatory Visit: Payer: Medicaid Other | Admitting: Sports Medicine

## 2021-05-23 ENCOUNTER — Encounter: Payer: Self-pay | Admitting: Neurology

## 2021-05-23 ENCOUNTER — Ambulatory Visit: Payer: Medicare Other | Admitting: Neurology
